# Patient Record
Sex: Male | Born: 1960 | Race: White | Hispanic: No | Smoking: Never smoker
Health system: Southern US, Community
[De-identification: ages and names within clinical notes are randomized; demographics above are authoritative.]

## PROBLEM LIST (undated history)

## (undated) DIAGNOSIS — E039 Hypothyroidism, unspecified: Secondary | ICD-10-CM

## (undated) DIAGNOSIS — D649 Anemia, unspecified: Secondary | ICD-10-CM

## (undated) DIAGNOSIS — E785 Hyperlipidemia, unspecified: Secondary | ICD-10-CM

## (undated) DIAGNOSIS — Z87442 Personal history of urinary calculi: Secondary | ICD-10-CM

## (undated) HISTORY — DX: Personal history of urinary calculi: Z87.442

## (undated) HISTORY — DX: Anemia, unspecified: D64.9

## (undated) HISTORY — DX: Hyperlipidemia, unspecified: E78.5

## (undated) HISTORY — DX: Hypothyroidism, unspecified: E03.9

## (undated) HISTORY — PX: TONSILLECTOMY: SUR1361

---

## 1998-09-17 ENCOUNTER — Encounter: Payer: Self-pay | Admitting: Emergency Medicine

## 1998-09-17 ENCOUNTER — Emergency Department (HOSPITAL_COMMUNITY): Admission: EM | Admit: 1998-09-17 | Discharge: 1998-09-17 | Payer: Self-pay | Admitting: Emergency Medicine

## 2004-07-29 ENCOUNTER — Ambulatory Visit: Payer: Self-pay | Admitting: Internal Medicine

## 2004-08-05 ENCOUNTER — Ambulatory Visit: Payer: Self-pay | Admitting: Internal Medicine

## 2005-08-10 ENCOUNTER — Ambulatory Visit: Payer: Self-pay | Admitting: Internal Medicine

## 2005-08-16 ENCOUNTER — Ambulatory Visit: Payer: Self-pay | Admitting: Internal Medicine

## 2005-10-28 ENCOUNTER — Ambulatory Visit: Payer: Self-pay | Admitting: Internal Medicine

## 2006-01-17 ENCOUNTER — Emergency Department (HOSPITAL_COMMUNITY): Admission: EM | Admit: 2006-01-17 | Discharge: 2006-01-17 | Payer: Self-pay | Admitting: Emergency Medicine

## 2006-08-12 ENCOUNTER — Ambulatory Visit: Payer: Self-pay | Admitting: Internal Medicine

## 2006-08-12 LAB — CONVERTED CEMR LAB
ALT: 17 units/L (ref 0–40)
AST: 19 units/L (ref 0–37)
Albumin: 4.1 g/dL (ref 3.5–5.2)
Alkaline Phosphatase: 77 units/L (ref 39–117)
BUN: 12 mg/dL (ref 6–23)
Basophils Absolute: 0.1 10*3/uL (ref 0.0–0.1)
Basophils Relative: 1.2 % — ABNORMAL HIGH (ref 0.0–1.0)
Bilirubin Urine: NEGATIVE
Bilirubin, Direct: 0.1 mg/dL (ref 0.0–0.3)
CO2: 32 meq/L (ref 19–32)
Calcium: 9.6 mg/dL (ref 8.4–10.5)
Chloride: 106 meq/L (ref 96–112)
Cholesterol: 174 mg/dL (ref 0–200)
Creatinine, Ser: 1 mg/dL (ref 0.4–1.5)
Eosinophils Absolute: 0.1 10*3/uL (ref 0.0–0.6)
Eosinophils Relative: 2.4 % (ref 0.0–5.0)
GFR calc Af Amer: 103 mL/min
GFR calc non Af Amer: 85 mL/min
Glucose, Bld: 96 mg/dL (ref 70–99)
HCT: 41.1 % (ref 39.0–52.0)
HDL: 28.2 mg/dL — ABNORMAL LOW (ref >39.0)
Hemoglobin, Urine: NEGATIVE
Hemoglobin: 14.1 g/dL (ref 13.0–17.0)
Ketones, ur: NEGATIVE mg/dL
LDL Cholesterol: 128 mg/dL — ABNORMAL HIGH (ref 0–99)
Leukocytes, UA: NEGATIVE
Lymphocytes Relative: 23 % (ref 12.0–46.0)
MCHC: 34.3 g/dL (ref 30.0–36.0)
MCV: 91.4 fL (ref 78.0–100.0)
Monocytes Absolute: 0.3 10*3/uL (ref 0.2–0.7)
Monocytes Relative: 5.5 % (ref 3.0–11.0)
Neutro Abs: 3.3 10*3/uL (ref 1.4–7.7)
Neutrophils Relative %: 67.9 % (ref 43.0–77.0)
Nitrite: NEGATIVE
PSA: 1.81 ng/mL (ref 0.10–4.00)
Platelets: 258 10*3/uL (ref 150–400)
Potassium: 4.3 meq/L (ref 3.5–5.1)
RBC: 4.5 M/uL (ref 4.22–5.81)
RDW: 11.7 % (ref 11.5–14.6)
Sodium: 141 meq/L (ref 135–145)
Specific Gravity, Urine: 1.01 (ref 1.000–1.03)
TSH: 0.25 microintl units/mL — ABNORMAL LOW (ref 0.35–5.50)
Total Bilirubin: 0.8 mg/dL (ref 0.3–1.2)
Total CHOL/HDL Ratio: 6.2
Total Protein, Urine: NEGATIVE mg/dL
Total Protein: 7.1 g/dL (ref 6.0–8.3)
Triglycerides: 87 mg/dL (ref 0–149)
Urine Glucose: NEGATIVE mg/dL
Urobilinogen, UA: 0.2 (ref 0.0–1.0)
VLDL: 17 mg/dL (ref 0–40)
WBC: 4.9 10*3/uL (ref 4.5–10.5)
pH: 6 (ref 5.0–8.0)

## 2006-08-19 ENCOUNTER — Ambulatory Visit: Payer: Self-pay | Admitting: Internal Medicine

## 2006-11-07 ENCOUNTER — Ambulatory Visit: Payer: Self-pay | Admitting: Internal Medicine

## 2006-11-07 LAB — CONVERTED CEMR LAB: TSH: 0.46 microintl units/mL (ref 0.35–5.50)

## 2007-02-01 ENCOUNTER — Telehealth: Payer: Self-pay | Admitting: Internal Medicine

## 2007-08-23 ENCOUNTER — Ambulatory Visit: Payer: Self-pay | Admitting: Internal Medicine

## 2007-08-23 LAB — CONVERTED CEMR LAB
ALT: 15 units/L (ref 0–53)
AST: 16 units/L (ref 0–37)
Albumin: 4 g/dL (ref 3.5–5.2)
Alkaline Phosphatase: 54 units/L (ref 39–117)
BUN: 16 mg/dL (ref 6–23)
Basophils Absolute: 0 10*3/uL (ref 0.0–0.1)
Basophils Relative: 0.8 % (ref 0.0–1.0)
Bilirubin Urine: NEGATIVE
Bilirubin, Direct: 0.1 mg/dL (ref 0.0–0.3)
CO2: 30 meq/L (ref 19–32)
Calcium: 9.2 mg/dL (ref 8.4–10.5)
Chloride: 103 meq/L (ref 96–112)
Cholesterol: 172 mg/dL (ref 0–200)
Creatinine, Ser: 1 mg/dL (ref 0.4–1.5)
Eosinophils Absolute: 0.2 10*3/uL (ref 0.0–0.7)
Eosinophils Relative: 4.3 % (ref 0.0–5.0)
GFR calc Af Amer: 103 mL/min
GFR calc non Af Amer: 85 mL/min
Glucose, Bld: 104 mg/dL — ABNORMAL HIGH (ref 70–99)
HCT: 37.8 % — ABNORMAL LOW (ref 39.0–52.0)
HDL: 27.4 mg/dL — ABNORMAL LOW (ref >39.0)
Hemoglobin, Urine: NEGATIVE
Hemoglobin: 13.2 g/dL (ref 13.0–17.0)
Ketones, ur: NEGATIVE mg/dL
LDL Cholesterol: 132 mg/dL — ABNORMAL HIGH (ref 0–99)
Leukocytes, UA: NEGATIVE
Lymphocytes Relative: 27.6 % (ref 12.0–46.0)
MCHC: 35 g/dL (ref 30.0–36.0)
MCV: 92.4 fL (ref 78.0–100.0)
Monocytes Absolute: 0.3 10*3/uL (ref 0.1–1.0)
Monocytes Relative: 5.9 % (ref 3.0–12.0)
Neutro Abs: 2.8 10*3/uL (ref 1.4–7.7)
Neutrophils Relative %: 61.4 % (ref 43.0–77.0)
Nitrite: NEGATIVE
PSA: 1.84 ng/mL (ref 0.10–4.00)
Platelets: 252 10*3/uL (ref 150–400)
Potassium: 4.3 meq/L (ref 3.5–5.1)
RBC: 4.09 M/uL — ABNORMAL LOW (ref 4.22–5.81)
RDW: 11.8 % (ref 11.5–14.6)
Sodium: 139 meq/L (ref 135–145)
Specific Gravity, Urine: 1.025 (ref 1.000–1.03)
TSH: 4.1 microintl units/mL (ref 0.35–5.50)
Total Bilirubin: 0.8 mg/dL (ref 0.3–1.2)
Total CHOL/HDL Ratio: 6.3
Total Protein, Urine: NEGATIVE mg/dL
Total Protein: 7.2 g/dL (ref 6.0–8.3)
Triglycerides: 65 mg/dL (ref 0–149)
Urine Glucose: NEGATIVE mg/dL
Urobilinogen, UA: 0.2 (ref 0.0–1.0)
VLDL: 13 mg/dL (ref 0–40)
WBC: 4.6 10*3/uL (ref 4.5–10.5)
pH: 6 (ref 5.0–8.0)

## 2007-08-29 ENCOUNTER — Ambulatory Visit: Payer: Self-pay | Admitting: Internal Medicine

## 2007-08-29 DIAGNOSIS — Z87442 Personal history of urinary calculi: Secondary | ICD-10-CM

## 2007-08-29 DIAGNOSIS — E039 Hypothyroidism, unspecified: Secondary | ICD-10-CM | POA: Insufficient documentation

## 2008-07-31 ENCOUNTER — Telehealth (INDEPENDENT_AMBULATORY_CARE_PROVIDER_SITE_OTHER): Payer: Self-pay | Admitting: *Deleted

## 2008-08-30 ENCOUNTER — Ambulatory Visit: Payer: Self-pay | Admitting: Internal Medicine

## 2008-08-30 LAB — CONVERTED CEMR LAB
ALT: 19 units/L (ref 0–53)
AST: 23 units/L (ref 0–37)
Albumin: 4 g/dL (ref 3.5–5.2)
Alkaline Phosphatase: 60 units/L (ref 39–117)
BUN: 17 mg/dL (ref 6–23)
Basophils Absolute: 0 10*3/uL (ref 0.0–0.1)
Basophils Relative: 0.9 % (ref 0.0–3.0)
Bilirubin Urine: NEGATIVE
Bilirubin, Direct: 0.1 mg/dL (ref 0.0–0.3)
CO2: 31 meq/L (ref 19–32)
Calcium: 9.3 mg/dL (ref 8.4–10.5)
Chloride: 106 meq/L (ref 96–112)
Cholesterol: 166 mg/dL (ref 0–200)
Creatinine, Ser: 1 mg/dL (ref 0.4–1.5)
Eosinophils Absolute: 0.1 10*3/uL (ref 0.0–0.7)
Eosinophils Relative: 2.9 % (ref 0.0–5.0)
GFR calc non Af Amer: 84.17 mL/min (ref >60)
Glucose, Bld: 98 mg/dL (ref 70–99)
HCT: 36.9 % — ABNORMAL LOW (ref 39.0–52.0)
HDL: 26.1 mg/dL — ABNORMAL LOW (ref >39.00)
Hemoglobin, Urine: NEGATIVE
Hemoglobin: 12.7 g/dL — ABNORMAL LOW (ref 13.0–17.0)
Ketones, ur: NEGATIVE mg/dL
LDL Cholesterol: 121 mg/dL — ABNORMAL HIGH (ref 0–99)
Leukocytes, UA: NEGATIVE
Lymphocytes Relative: 27.9 % (ref 12.0–46.0)
Lymphs Abs: 0.8 10*3/uL (ref 0.7–4.0)
MCHC: 34.4 g/dL (ref 30.0–36.0)
MCV: 87.6 fL (ref 78.0–100.0)
Monocytes Absolute: 0.4 10*3/uL (ref 0.1–1.0)
Monocytes Relative: 12.8 % — ABNORMAL HIGH (ref 3.0–12.0)
Neutro Abs: 1.7 10*3/uL (ref 1.4–7.7)
Neutrophils Relative %: 55.5 % (ref 43.0–77.0)
Nitrite: NEGATIVE
PSA: 1.57 ng/mL (ref 0.10–4.00)
Platelets: 234 10*3/uL (ref 150.0–400.0)
Potassium: 4.5 meq/L (ref 3.5–5.1)
RBC: 4.21 M/uL — ABNORMAL LOW (ref 4.22–5.81)
RDW: 12.5 % (ref 11.5–14.6)
Sodium: 142 meq/L (ref 135–145)
Specific Gravity, Urine: 1.025 (ref 1.000–1.030)
TSH: 0.76 microintl units/mL (ref 0.35–5.50)
Total Bilirubin: 0.8 mg/dL (ref 0.3–1.2)
Total CHOL/HDL Ratio: 6
Total Protein, Urine: NEGATIVE mg/dL
Total Protein: 7.3 g/dL (ref 6.0–8.3)
Triglycerides: 97 mg/dL (ref 0.0–149.0)
Urine Glucose: NEGATIVE mg/dL
Urobilinogen, UA: 0.2 (ref 0.0–1.0)
VLDL: 19.4 mg/dL (ref 0.0–40.0)
WBC: 3 10*3/uL — ABNORMAL LOW (ref 4.5–10.5)
pH: 5.5 (ref 5.0–8.0)

## 2008-09-18 ENCOUNTER — Ambulatory Visit: Payer: Self-pay | Admitting: Internal Medicine

## 2009-09-12 ENCOUNTER — Ambulatory Visit: Payer: Self-pay | Admitting: Internal Medicine

## 2009-09-12 LAB — CONVERTED CEMR LAB
ALT: 15 units/L (ref 0–53)
AST: 16 units/L (ref 0–37)
Albumin: 4.5 g/dL (ref 3.5–5.2)
Alkaline Phosphatase: 64 units/L (ref 39–117)
BUN: 21 mg/dL (ref 6–23)
Basophils Absolute: 0 10*3/uL (ref 0.0–0.1)
Basophils Relative: 0.7 % (ref 0.0–3.0)
Bilirubin Urine: NEGATIVE
Bilirubin, Direct: 0.2 mg/dL (ref 0.0–0.3)
CO2: 32 meq/L (ref 19–32)
Calcium: 9.7 mg/dL (ref 8.4–10.5)
Chloride: 107 meq/L (ref 96–112)
Cholesterol: 184 mg/dL (ref 0–200)
Creatinine, Ser: 1.1 mg/dL (ref 0.4–1.5)
Eosinophils Absolute: 0.2 10*3/uL (ref 0.0–0.7)
Eosinophils Relative: 3.8 % (ref 0.0–5.0)
GFR calc non Af Amer: 75.89 mL/min (ref >60)
Glucose, Bld: 89 mg/dL (ref 70–99)
HCT: 41.5 % (ref 39.0–52.0)
HDL: 34 mg/dL — ABNORMAL LOW (ref >39.00)
Hemoglobin, Urine: NEGATIVE
Hemoglobin: 14.6 g/dL (ref 13.0–17.0)
Ketones, ur: NEGATIVE mg/dL
LDL Cholesterol: 136 mg/dL — ABNORMAL HIGH (ref 0–99)
Leukocytes, UA: NEGATIVE
Lymphocytes Relative: 26.3 % (ref 12.0–46.0)
Lymphs Abs: 1.3 10*3/uL (ref 0.7–4.0)
MCHC: 35.2 g/dL (ref 30.0–36.0)
MCV: 93.3 fL (ref 78.0–100.0)
Monocytes Absolute: 0.3 10*3/uL (ref 0.1–1.0)
Monocytes Relative: 5.7 % (ref 3.0–12.0)
Neutro Abs: 3.2 10*3/uL (ref 1.4–7.7)
Neutrophils Relative %: 63.5 % (ref 43.0–77.0)
Nitrite: NEGATIVE
PSA: 1.64 ng/mL (ref 0.10–4.00)
Platelets: 229 10*3/uL (ref 150.0–400.0)
Potassium: 4.9 meq/L (ref 3.5–5.1)
RBC: 4.45 M/uL (ref 4.22–5.81)
RDW: 12.7 % (ref 11.5–14.6)
Sodium: 140 meq/L (ref 135–145)
Specific Gravity, Urine: 1.025 (ref 1.000–1.030)
TSH: 0.68 microintl units/mL (ref 0.35–5.50)
Total Bilirubin: 1 mg/dL (ref 0.3–1.2)
Total CHOL/HDL Ratio: 5
Total Protein, Urine: NEGATIVE mg/dL
Total Protein: 7.4 g/dL (ref 6.0–8.3)
Triglycerides: 71 mg/dL (ref 0.0–149.0)
Urine Glucose: NEGATIVE mg/dL
Urobilinogen, UA: 0.2 (ref 0.0–1.0)
VLDL: 14.2 mg/dL (ref 0.0–40.0)
WBC: 5 10*3/uL (ref 4.5–10.5)
pH: 6 (ref 5.0–8.0)

## 2009-09-19 ENCOUNTER — Ambulatory Visit: Payer: Self-pay | Admitting: Internal Medicine

## 2009-10-21 ENCOUNTER — Encounter (INDEPENDENT_AMBULATORY_CARE_PROVIDER_SITE_OTHER): Payer: Self-pay | Admitting: *Deleted

## 2009-11-28 ENCOUNTER — Encounter (INDEPENDENT_AMBULATORY_CARE_PROVIDER_SITE_OTHER): Payer: Self-pay | Admitting: *Deleted

## 2009-12-01 ENCOUNTER — Ambulatory Visit: Payer: Self-pay | Admitting: Internal Medicine

## 2009-12-09 ENCOUNTER — Ambulatory Visit: Payer: Self-pay | Admitting: Internal Medicine

## 2009-12-22 ENCOUNTER — Ambulatory Visit: Payer: Self-pay | Admitting: Internal Medicine

## 2009-12-24 LAB — CONVERTED CEMR LAB
BUN: 24 mg/dL — ABNORMAL HIGH (ref 6–23)
Chloride: 101 meq/L (ref 96–112)
Cholesterol: 156 mg/dL (ref 0–200)
Creatinine, Ser: 1.2 mg/dL (ref 0.4–1.5)
LDL Cholesterol: 108 mg/dL — ABNORMAL HIGH (ref 0–99)
Total CHOL/HDL Ratio: 5

## 2010-03-31 NOTE — Assessment & Plan Note (Signed)
Summary: CPX /NWS   Vital Signs:  Patient profile:   50 year old male Height:      72 inches Weight:      197 pounds BMI:     26.81 O2 Sat:      98 % on Room air Temp:     97.7 degrees F oral Pulse rate:   60 / minute Pulse rhythm:   regular Resp:     16 per minute BP sitting:   130 / 88  (left arm) Cuff size:   regular  Vitals Entered By: Lanier Prude, CMA(AAMA) (September 19, 2009 3:20 PM)  O2 Flow:  Room air CC: CPX Is Patient Diabetic? No   CC:  CPX.  History of Present Illness: The patient presents for a wellness examination   Current Medications (verified): 1)  Levothyroxine Sodium 150 Mcg  Tabs (Levothyroxine Sodium) .... Once Daily 2)  Vitamin D3 1000 Unit  Tabs (Cholecalciferol) .Marland Kitchen.. 1 By Mouth Daily 3)  Acyclovir 200 Mg Caps (Acyclovir) .... Once Daily  Allergies (verified): No Known Drug Allergies  Past History:  Past Surgical History: Last updated: 08/29/2007 Denies surgical history  Family History: Last updated: 08/29/2007 F was depressed and comm. suicide at 69  Social History: Last updated: 09/18/2008 Married no children Never Smoked Alcohol use-no Regular exercise-yes tennis, running  Past Medical History: Hypothyroidism Nephrolithiasis, hx of Blood donor q 2-3 mo Hyperlipidemia  Review of Systems  The patient denies anorexia, fever, weight loss, weight gain, vision loss, decreased hearing, hoarseness, chest pain, syncope, dyspnea on exertion, peripheral edema, prolonged cough, headaches, hemoptysis, abdominal pain, melena, hematochezia, severe indigestion/heartburn, hematuria, incontinence, genital sores, muscle weakness, suspicious skin lesions, transient blindness, difficulty walking, depression, unusual weight change, abnormal bleeding, enlarged lymph nodes, angioedema, and testicular masses.    Physical Exam  General:  Well-developed,well-nourished,in no acute distress; alert,appropriate and cooperative throughout examination Head:   Normocephalic and atraumatic without obvious abnormalities. No apparent alopecia or balding. Eyes:  No corneal or conjunctival inflammation noted. EOMI. Perrla. Funduscopic exam benign, without hemorrhages, exudates or papilledema. Vision grossly normal. Ears:  External ear exam shows no significant lesions or deformities.  Otoscopic examination reveals clear canals, tympanic membranes are intact bilaterally without bulging, retraction, inflammation or discharge. Hearing is grossly normal bilaterally. Nose:  External nasal examination shows no deformity or inflammation. Nasal mucosa are pink and moist without lesions or exudates. Mouth:  Oral mucosa and oropharynx without lesions or exudates.  Teeth in good repair. Neck:  No deformities, masses, or tenderness noted. Chest Wall:  No deformities, masses, tenderness or gynecomastia noted. Lungs:  Normal respiratory effort, chest expands symmetrically. Lungs are clear to auscultation, no crackles or wheezes. Heart:  Normal rate and regular rhythm. S1 and S2 normal without gallop, murmur, click, rub or other extra sounds. Abdomen:  Bowel sounds positive,abdomen soft and non-tender without masses, organomegaly or hernias noted. Rectal:  def to GI Prostate:  def to GI Msk:  No deformity or scoliosis noted of thoracic or lumbar spine.   Pulses:  R and L carotid,radial,femoral,dorsalis pedis and posterior tibial pulses are full and equal bilaterally Extremities:  No clubbing, cyanosis, edema, or deformity noted with normal full range of motion of all joints.   Neurologic:  No cranial nerve deficits noted. Station and gait are normal. Plantar reflexes are down-going bilaterally. DTRs are symmetrical throughout. Sensory, motor and coordinative functions appear intact. Skin:  Intact without suspicious lesions or rashes Cervical Nodes:  No lymphadenopathy noted Inguinal Nodes:  No  significant adenopathy Psych:  Cognition and judgment appear intact. Alert and  cooperative with normal attention span and concentration. No apparent delusions, illusions, hallucinations   Impression & Recommendations:  Problem # 1:  WELL ADULT EXAM (ICD-V70.0) Assessment New Health and age related issues were discussed. Available screening tests and vaccinations were discussed as well. Healthy life style including good diet and execise was discussed.  Orders: EKG w/ Interpretation (93000) Gastroenterology Referral (GI)  Problem # 2:  HYPOTHYROIDISM (ICD-244.9) Assessment: Unchanged  His updated medication list for this problem includes:    Levothyroxine Sodium 150 Mcg Tabs (Levothyroxine sodium) ..... Once daily  Problem # 3:  HYPERLIPIDEMIA (ICD-272.4) Assessment: Deteriorated  His updated medication list for this problem includes:    Pravastatin Sodium 20 Mg Tabs (Pravastatin sodium) .Marland Kitchen... 1 by mouth qd  Labs Reviewed: SGOT: 16 (09/12/2009)   SGPT: 15 (09/12/2009)   HDL:34.00 (09/12/2009), 26.10 (08/30/2008)  LDL:136 (09/12/2009), 121 (08/30/2008)  Chol:184 (09/12/2009), 166 (08/30/2008)  Trig:71.0 (09/12/2009), 97.0 (08/30/2008)  Complete Medication List: 1)  Levothyroxine Sodium 150 Mcg Tabs (Levothyroxine sodium) .... Once daily 2)  Vitamin D3 1000 Unit Tabs (Cholecalciferol) .Marland Kitchen.. 1 by mouth daily 3)  Acyclovir 200 Mg Caps (Acyclovir) .... Once daily 4)  Aspirin 81 Mg Tbec (Aspirin) .Marland Kitchen.. 1 by mouth qd 5)  Pravastatin Sodium 20 Mg Tabs (Pravastatin sodium) .Marland Kitchen.. 1 by mouth qd  Patient Instructions: 1)  Please schedule a follow-up appointment in 1 year. 2)  Please schedule a follow-up appointment in 3 months. 3)  BMP prior to visit, ICD-9: 4)  Lipid Panel prior to visit, ICD-9:272.0 995.20 Prescriptions: PRAVASTATIN SODIUM 20 MG TABS (PRAVASTATIN SODIUM) 1 by mouth qd  #30 x 12   Entered and Authorized by:   Tresa Garter MD   Signed by:   Tresa Garter MD on 09/19/2009   Method used:   [facility] to        [facility]   [phone]* (retail)       [address]       [place], [place]  [address]       Ph: [phone] or [phone]       Fax: [phone]   RxID:   [id number] LEVOTHYROXINE SODIUM 150 MCG  TABS (LEVOTHYROXINE SODIUM) once daily  #30 x 12   Entered and Authorized by:   Tresa Garter MD   Signed by:   Tresa Garter MD on 09/19/2009   Method used:   [facility]ronically to        [facility]  [phone]* (retail)       [address] [place], [place]  [address]       Ph: [phone] or [phone]       Fax: [phone]   RxID:   [id number] LEVOTHYROXINE SODIUM 150 MCG  TABS (LEVOTHYROXINE SODIUM) once daily  #30 x 12   Entered and Authorized by:   Tresa Garter MD   Signed by:   Tresa Garter MD on 09/19/2009   Method used:   Print then Give to Patient   RxID:   [id number] PRAVASTATIN SODIUM 20 MG TABS (PRAVASTATIN SODIUM) 1 by mouth qd  #30 x 12   Entered and Authorized by:   Tresa Garter MD   Signed by:   Tresa Garter MD on 09/19/2009   Method used:   Print then Give to Patient   RxID:  [id number]  

## 2010-03-31 NOTE — Letter (Signed)
Summary: [facility] Instructions  [facility]  [address] [place], [place] [address]   Phone: [phone]  Fax: [phone]       Derrick Walters    52/13/1962    MRN: [id number]        Procedure Day Dorna Bloom:  Jake Shark  12/09/09     Arrival Time:  10:00AM     Procedure Time:  11:00AM     Location of Procedure:                    _X _  [facility] (4th Floor)                       PREPARATION FOR COLONOSCOPY WITH MOVIPREP   Starting 5 days prior to your procedure 12/04/09 do not eat nuts, seeds, popcorn, corn, beans, peas,  salads, or any raw vegetables.  Do not take any fiber supplements (e.g. Metamucil, Citrucel, and Benefiber).  THE DAY BEFORE YOUR PROCEDURE         DATE: 12/08/09  DAY: MONDAY  1.  Drink clear liquids the entire day-NO SOLID FOOD  2.  Do not drink anything colored red or purple.  Avoid juices with pulp.  No orange juice.  3.  Drink at least 64 oz. (8 glasses) of fluid/clear liquids during the day to prevent dehydration and help the prep work efficiently.  CLEAR LIQUIDS INCLUDE: Water Jello Ice Popsicles Tea (sugar ok, no milk/cream) Powdered fruit flavored drinks Coffee (sugar ok, no milk/cream) Gatorade Juice: apple, white grape, white cranberry  Lemonade Clear bullion, consomm, broth Carbonated beverages (any kind) Strained chicken noodle soup Hard Candy                             4.  In the morning, mix first dose of MoviPrep solution:    Empty 1 Pouch A and 1 Pouch B into the disposable container    Add lukewarm drinking water to the top line of the container. Mix to dissolve    Refrigerate (mixed solution should be used within 24 hrs)  5.  Begin drinking the prep at 5:00 p.m. The MoviPrep container is divided by 4 marks.   Every 15 minutes drink the solution down to the next mark (approximately 8 oz) until the full liter is complete.   6.  Follow completed prep with 16 oz of clear liquid of your choice (Nothing  red or purple).  Continue to drink clear liquids until bedtime.  7.  Before going to bed, mix second dose of MoviPrep solution:    Empty 1 Pouch A and 1 Pouch B into the disposable container    Add lukewarm drinking water to the top line of the container. Mix to dissolve    Refrigerate  THE DAY OF YOUR PROCEDURE      DATE: 12/09/09  DAY: TUESDAY  Beginning at 6:00AM (5 hours before procedure):         1. Every 15 minutes, drink the solution down to the next mark (approx 8 oz) until the full liter is complete.  2. Follow completed prep with 16 oz. of clear liquid of your choice.    3. You may drink clear liquids until 9:00AM (2 HOURS BEFORE PROCEDURE).   MEDICATION INSTRUCTIONS  Unless otherwise instructed, you should take regular prescription medications with a small sip of water   as early as possible the morning of  your procedure.    Additional medication instructions: n/a         OTHER INSTRUCTIONS  You will need a responsible adult at least 52 years of age to accompany you and drive you home.   This person must remain in the waiting room during your procedure.  Wear loose fitting clothing that is easily removed.  Leave jewelry and other valuables at home.  However, you may wish to bring a book to read or  an iPod/MP3 player to listen to music as you wait for your procedure to start.  Remove all body piercing jewelry and leave at home.  Total time from sign-in until discharge is approximately 2-3 hours.  You should go home directly after your procedure and rest.  You can resume normal activities the  day after your procedure.  The day of your procedure you should not:   Drive   Make legal decisions   Operate machinery   Drink alcohol   Return to work  You will receive specific instructions about eating, activities and medications before you leave.    The above instructions have been reviewed and explained to me by   Sherren Kerns RN  December 01, 2009 8:04 AM   I fully understand and can verbalize these instructions _____________________________ Date _________

## 2010-03-31 NOTE — Miscellaneous (Signed)
Summary: previsit prep/rm  Clinical Lists Changes  Medications: Added new medication of MOVIPREP 100 GM  SOLR (PEG-KCL-NACL-NASULF-NA ASC-C) As per prep instructions. - Signed Rx of MOVIPREP 100 GM  SOLR (PEG-KCL-NACL-NASULF-NA ASC-C) As per prep instructions.;  #1 x 0;  Signed;  Entered by: Sherren Kerns RN;  Authorized by: Iva Boop MD, [facility];  Method used: [facility]  [phone]*, [address], [place], [place]  [address], Ph: [phone] or [phone], Fax: [phone] Observations: Added new observation of ALLERGY REV: Done (12/01/2009 7:50)    Prescriptions: MOVIPREP 100 GM  SOLR (PEG-KCL-NACL-NASULF-NA ASC-C) As per prep instructions.  #1 x 0   Entered by:   Sherren Kerns RN   Authorized by:   Iva Boop MD, [facility]   Signed by:   Sherren Kerns RN on 12/01/2009   Method used:   Electronically to        [facility]  [phone]* (retail)       [address]       [place], [place]  [address]       Ph: [phone] or [phone]       Fax: [phone]   RxID:   [id number]

## 2010-03-31 NOTE — Procedures (Signed)
Summary: Colonoscopy  Patient: Derrick Walters Note: All result statuses are Final unless otherwise noted.  Tests: (1) Colonoscopy (COL)   COL Colonoscopy           DONE     [facility]     [address]     [place], [place]  [address]           COLONOSCOPY PROCEDURE REPORT           PATIENT:  Derrick, Walters  MR#:  [id number]     BIRTHDATE:  11-Dec-1960, 48 yrs. old  GENDER:  male     ENDOSCOPIST:  Iva Boop, MD, [facility]     REF. BY:  Linda Hedges. Plotnikov, M.D.     PROCEDURE DATE:  12/09/2009     PROCEDURE:  Colonoscopy 19[address]     ASA CLASS:  Class II     INDICATIONS:  Routine Risk Screening     MEDICATIONS:   Fentanyl 75 mcg IV, Versed 7 mg IV           DESCRIPTION OF PROCEDURE:   After the risks benefits and     alternatives of the procedure were thoroughly explained, informed     consent was obtained.  Digital rectal exam was performed and     revealed no abnormalities and normal prostate.   The LB CF-H180AL     [id number] endoscope was introduced through the anus and advanced to     the cecum, which was identified by both the appendix and ileocecal     valve, without limitations.  The quality of the prep was     excellent, using MoviPrep.  The instrument was then slowly     withdrawn as the colon was fully examined. Insertion: 5:05 minutes     Withdrawal: 8:14 minutes     <<PROCEDUREIMAGES>>           FINDINGS:  Mild diverticulosis was found in the sigmoid colon.     This was otherwise a normal examination of the colon.   Retroflexed     views in the rectum and right colon revealed no abnormalities.     The scope was then withdrawn from the patient and the procedure     completed.           COMPLICATIONS:  None           ENDOSCOPIC IMPRESSION:     1) Mild diverticulosis in the sigmoid colon     2) Otherwise normal, excellent prep           REPEAT EXAM:  In 10 year(s) for routine screening colonoscopy.           Iva Boop, MD, [facility]           CC:  Linda Hedges.  Plotnikov, MD     The Patient           n.     eSIGNED:   Iva Boop at 12/09/2009 12:07 PM           Lenor Derrick, [phone]  Note: An exclamation mark (!) indicates a result that was not dispersed into the flowsheet. Document Creation Date: 12/09/2009 12:09 PM _______________________________________________________________________  (1) Order result status: Final Collection or observation date-time: 12/09/2009 11:59 Requested date-time:  Receipt date-time:  Reported date-time:  Referring Physician:   Ordering Physician: Stan Head [phone]) Specimen Source:  Source: Launa Grill Order Number: [id number] Lab site:   Appended Document: Colonoscopy  Clinical Lists Changes  Observations: Added new observation of COLONNXTDUE: 11/2019 (12/09/2009 15:12)

## 2010-03-31 NOTE — Letter (Signed)
Summary: Previsit letter  [facility]  [address] [place], [place] [address]   Phone: [phone]  Fax: [phone]       10/21/2009 MRN: [id number]  [facility] [address] [place], [place]  [address]  Dear Derrick Walters,  Welcome to the Gastroenterology Division at [facility].    You are scheduled to see a nurse for your pre-procedure visit on 12/01/2009 at 8:00AM on the 3rd floor at [facility], [address]  We ask that you try to arrive at our office 15 minutes prior to your appointment time to allow for check-in.  Your nurse visit will consist of discussing your medical and surgical history, your immediate family medical history, and your medications.    Please bring a complete list of all your medications or, if you prefer, bring the medication bottles and we will list them.  We will need to be aware of both prescribed and over the counter drugs.  We will need to know exact dosage information as well.  If you are on blood thinners (Coumadin, Plavix, Aggrenox, Ticlid, etc.) please call our office today/prior to your appointment, as we need to consult with your physician about holding your medication.   Please be prepared to read and sign documents such as consent forms, a financial agreement, and acknowledgement forms.  If necessary, and with your consent, a friend or relative is welcome to sit-in on the nurse visit with you.  Please bring your insurance card so that we may make a copy of it.  If your insurance requires a referral to see a specialist, please bring your referral form from your primary care physician.  No co-pay is required for this nurse visit.     If you cannot keep your appointment, please call [phone] to cancel or reschedule prior to your appointment date.  This allows [place] the opportunity to schedule an appointment for another patient in need of care.    Thank you for choosing [facility] for your medical needs.  We  appreciate the opportunity to care for you.  Please visit [place] at our website  to learn more about our practice.                     Sincerely.                                                                                                                   The Gastroenterology Division

## 2010-07-14 NOTE — Assessment & Plan Note (Signed)
[facility]                           PRIMARY CARE OFFICE NOTE   NAME:Derrick Walters, Derrick Walters                        MRN:          [id number]  DATE:08/19/2006                            DOB:          [birth year]    The patient is a 50 year old male who presents for a wellness  examination.   ALLERGIES:  None.   PAST MEDICAL HISTORY/FAMILY HISTORY/SOCIAL HISTORY:  As per August 16, 2005 note.  He is being interviewed for another job with [employer].   CURRENT MEDICATIONS:  1. Acyclovir 400 mg daily for ophthalmic zoster.  2. Levothroid 175 mcg a day.  3. Multivitamin at times.   REVIEW OF SYSTEMS:  No chest pain or shortness of breath.  No syncope.  No neurological complaints.  No eye complaints.  The rest of the 18-  point review of systems is negative.   PHYSICAL:  Blood pressure 130/86, pulse 71, temperature 97.5, weight 200  pounds.  HEENT:  Moist mucosa.  NECK:  Supple.  No thyromegaly or bruits.  LUNGS:  Clear to auscultation and percussion.  No wheeze or rales.  HEART:  S1, S2.  No murmur.  No gallop.  ABDOMEN:  Soft and nontender.  No organomegaly or mass felt.  LOWER EXTREMITIES:  Without edema.  He is alert and cooperative.  Denies being depressed.  Normal external genitalia.  ABDOMEN:  No organomegaly or mass.  SKIN:  Clear.   LABS:  On August 12, 2006, CBC normal, CMET normal.  TSH 0.25.  Cholesterol 134, HDL 28, LDL 128.  EKG today is normal.  PSA 1.81.  Urinalysis normal.   ASSESSMENT AND PLAN:  1. Normal wellness examination.  Age/health issues discussed.  Healthy      life-style discussed.  Repeat exam in 12 months.  Vaccination      discussed.  He had a tetanus shot in 2006.  2. Hypothyroidism with decreased TSH.  Will reduce Synthroid to 150      mcg a day.  Check TSH in 2 to 3 months.  3. Low HDL.  He will start Lovaza 2 capsules twice daily.  Repeat      lipids in 3 months.    Georgina Quint. Plotnikov, MD  Electronically Signed   AVP/MedQ  DD: 08/20/2006  DT: 08/20/2006  Job #: [id number]

## 2010-07-17 NOTE — Assessment & Plan Note (Signed)
[facility]                             PRIMARY CARE OFFICE NOTE   NAME:Condie, SION REINDERS                        MRN:          [id number]  DATE:10/28/2005                            DOB:          12-13-1960    PROCEDURE:  Skin biopsy.   INDICATIONS:  Mole with irregular color and borders.   The risks, including bleeding, infection, scar formation, incomplete  procedure, were explained to the patient in detail.  He agreed to proceed.   Mole #1 on the left upper back, measuring 3 mm, was prepped with Betadine  and alcohol 0.5 cc and removed with sterile derm blade.  Specimen sent to  the lab.  The wound was treated with Hyfercater.  Tolerated it well.  Complications none.  Wound instructions provided.   Mole #2, below mole #1, measuring 3 mm, was removed in an identical fashion.  The specimen sent to lab.  Tolerated it well.  Complications none.                                   Sonda Primes, MD   AP/MedQ  DD:  10/28/2005  DT:  10/28/2005  Job #:  [id number]

## 2010-09-30 ENCOUNTER — Other Ambulatory Visit: Payer: Self-pay | Admitting: Internal Medicine

## 2010-09-30 ENCOUNTER — Other Ambulatory Visit (INDEPENDENT_AMBULATORY_CARE_PROVIDER_SITE_OTHER): Payer: Self-pay

## 2010-09-30 DIAGNOSIS — Z0000 Encounter for general adult medical examination without abnormal findings: Secondary | ICD-10-CM

## 2010-09-30 DIAGNOSIS — Z0389 Encounter for observation for other suspected diseases and conditions ruled out: Secondary | ICD-10-CM

## 2010-09-30 LAB — BASIC METABOLIC PANEL
BUN: 18 mg/dL (ref 6–23)
CO2: 31 mEq/L (ref 19–32)
Chloride: 103 mEq/L (ref 96–112)
Potassium: 4.5 mEq/L (ref 3.5–5.1)

## 2010-09-30 LAB — LIPID PANEL
Cholesterol: 137 mg/dL (ref 0–200)
LDL Cholesterol: 88 mg/dL (ref 0–99)
Total CHOL/HDL Ratio: 5
VLDL: 19.6 mg/dL (ref 0.0–40.0)

## 2010-09-30 LAB — TSH: TSH: 2.29 u[IU]/mL (ref 0.35–5.50)

## 2010-09-30 LAB — URINALYSIS
Hgb urine dipstick: NEGATIVE
Ketones, ur: NEGATIVE
Leukocytes, UA: NEGATIVE
Urine Glucose: NEGATIVE
Urobilinogen, UA: 0.2 (ref 0.0–1.0)

## 2010-09-30 LAB — CBC WITH DIFFERENTIAL/PLATELET
Eosinophils Relative: 4.7 % (ref 0.0–5.0)
Monocytes Relative: 5.9 % (ref 3.0–12.0)
Neutrophils Relative %: 62.8 % (ref 43.0–77.0)
Platelets: 220 10*3/uL (ref 150.0–400.0)
WBC: 4.8 10*3/uL (ref 4.5–10.5)

## 2010-09-30 LAB — PSA: PSA: 1.69 ng/mL (ref 0.10–4.00)

## 2010-10-01 LAB — HEPATIC FUNCTION PANEL
ALT: 22 U/L (ref 0–53)
Total Bilirubin: 0.6 mg/dL (ref 0.3–1.2)
Total Protein: 7 g/dL (ref 6.0–8.3)

## 2010-10-07 ENCOUNTER — Ambulatory Visit (INDEPENDENT_AMBULATORY_CARE_PROVIDER_SITE_OTHER): Payer: BC Managed Care – PPO | Admitting: Internal Medicine

## 2010-10-07 ENCOUNTER — Encounter: Payer: Self-pay | Admitting: Internal Medicine

## 2010-10-07 VITALS — BP 118/80 | HR 84 | Temp 98.6°F | Resp 16 | Ht 73.0 in | Wt 203.0 lb

## 2010-10-07 DIAGNOSIS — E785 Hyperlipidemia, unspecified: Secondary | ICD-10-CM

## 2010-10-07 DIAGNOSIS — Z0000 Encounter for general adult medical examination without abnormal findings: Secondary | ICD-10-CM

## 2010-10-07 DIAGNOSIS — E039 Hypothyroidism, unspecified: Secondary | ICD-10-CM

## 2010-10-07 DIAGNOSIS — D485 Neoplasm of uncertain behavior of skin: Secondary | ICD-10-CM | POA: Insufficient documentation

## 2010-10-07 MED ORDER — OMEGA-3 FATTY ACIDS 1000 MG PO CAPS: 2.0000 g | ORAL_CAPSULE | Freq: Every day | ORAL | Status: DC

## 2010-10-07 MED ORDER — LEVOTHYROXINE SODIUM 150 MCG PO TABS: 150.0000 ug | ORAL_TABLET | Freq: Every day | ORAL | Status: DC

## 2010-10-07 NOTE — Assessment & Plan Note (Signed)
Issues discussed.

## 2010-10-07 NOTE — Assessment & Plan Note (Signed)
On Rx 

## 2010-10-07 NOTE — Assessment & Plan Note (Signed)
D/c Pravachol - did not help Start fish oil

## 2010-10-07 NOTE — Assessment & Plan Note (Signed)
Will bx 

## 2010-10-07 NOTE — Progress Notes (Signed)
  Subjective:    Patient ID: Derrick Walters, male    DOB: Jul 05, 1960, 50 y.o.   MRN: [id number]  HPI The patient is here for a wellness exam. The patient has been doing well overall without major physical or psychological issues going on lately.   Review of Systems  Constitutional: Negative for appetite change, fatigue and unexpected weight change.  HENT: Negative for nosebleeds, congestion, sore throat, sneezing, trouble swallowing and neck pain.   Eyes: Negative for itching and visual disturbance.  Respiratory: Negative for cough.   Cardiovascular: Negative for chest pain, palpitations and leg swelling.  Gastrointestinal: Negative for nausea, diarrhea, blood in stool and abdominal distention.  Genitourinary: Negative for frequency and hematuria.  Musculoskeletal: Negative for back pain, joint swelling and gait problem.  Skin: Negative for rash.  Neurological: Negative for dizziness, tremors, speech difficulty and weakness.  Psychiatric/Behavioral: Negative for sleep disturbance, dysphoric mood and agitation. The patient is not nervous/anxious.        Objective:   Physical Exam  Constitutional: He is oriented to person, place, and time. He appears well-developed.  HENT:  Mouth/Throat: Oropharynx is clear and moist.  Eyes: Conjunctivae are normal. Pupils are equal, round, and reactive to light.  Neck: Normal range of motion. No JVD present. No thyromegaly present.  Cardiovascular: Normal rate, regular rhythm, normal heart sounds and intact distal pulses.  Exam reveals no gallop and no friction rub.   No murmur heard. Pulmonary/Chest: Effort normal and breath sounds normal. No respiratory distress. He has no wheezes. He has no rales. He exhibits no tenderness.  Abdominal: Soft. Bowel sounds are normal. He exhibits no distension and no mass. There is no tenderness. There is no rebound and no guarding.  Genitourinary: Rectum normal, prostate normal and penis normal.  Musculoskeletal:  Normal range of motion. He exhibits no edema and no tenderness.  Lymphadenopathy:    He has no cervical adenopathy.  Neurological: He is alert and oriented to person, place, and time. He has normal reflexes. No cranial nerve deficit. He exhibits normal muscle tone. Coordination normal.  Skin: Skin is warm and dry. No rash noted.       Mole L chest irreg  Psychiatric: He has a normal mood and affect. His behavior is normal. Judgment and thought content normal.     Lab Results  Component Value Date   WBC 4.8 09/30/2010   HGB 13.8 09/30/2010   HCT 40.6 09/30/2010   PLT 220.0 09/30/2010   CHOL 137 09/30/2010   TRIG 98.0 09/30/2010   HDL 29.80* 09/30/2010   ALT 22 09/30/2010   AST 21 09/30/2010   NA 140 09/30/2010   K 4.5 09/30/2010   CL 103 09/30/2010   CREATININE 1.0 09/30/2010   BUN 18 09/30/2010   CO2 31 09/30/2010   TSH 2.29 09/30/2010   PSA 1.69 09/30/2010        Assessment & Plan:

## 2010-10-20 ENCOUNTER — Other Ambulatory Visit: Payer: Self-pay | Admitting: Internal Medicine

## 2010-11-27 ENCOUNTER — Other Ambulatory Visit: Payer: Self-pay | Admitting: *Deleted

## 2010-11-27 MED ORDER — LEVOTHYROXINE SODIUM 150 MCG PO TABS: 150.0000 ug | ORAL_TABLET | Freq: Every day | ORAL | Status: DC

## 2011-02-17 ENCOUNTER — Encounter: Payer: Self-pay | Admitting: Internal Medicine

## 2011-02-17 ENCOUNTER — Ambulatory Visit (INDEPENDENT_AMBULATORY_CARE_PROVIDER_SITE_OTHER): Payer: BC Managed Care – PPO | Admitting: Internal Medicine

## 2011-02-17 VITALS — BP 120/90 | HR 76 | Temp 98.0°F | Resp 16 | Wt 206.0 lb

## 2011-02-17 DIAGNOSIS — D489 Neoplasm of uncertain behavior, unspecified: Secondary | ICD-10-CM

## 2011-02-17 NOTE — Progress Notes (Signed)
  Subjective:    Patient ID: Derrick Walters, male    DOB: 08/27/1960, 50 y.o.   MRN: [id number]  HPI  Skin bx  Review of Systems     Objective:   Physical Exam  Procedure Note :     Procedure :  Skin biopsy   Indication:  Changing mole (s ),  Suspicious lesion(s)   Risks including unsuccessful procedure , bleeding, infection, bruising, scar, a need for another complete procedure and others were explained to the patient in detail as well as the benefits. Informed consent was obtained and signed.   The patient was placed in a decubitus position.  Lesion #1 on  L clavicular area   measuring  6x5   mm   Skin over lesion #1  was prepped with Betadine and alcohol  and anesthetized with 1 cc of 2% lidocaine and epinephrine, using a 25-gauge 1 inch needle.  Shave biopsy with a sterile Dermablade was carried out in the usual fashion. Hyfrecator was used to destroy the rest of the lesion potentially left behind and for hemostasis. Band-Aid was applied with antibiotic ointment.     Postprocedure instructions :    A Band-Aid should be  changed twice daily. You can take a shower tomorrow.  Keep the wounds clean. You can wash them with liquid soap and water. Pat dry with gauze or a Kleenex tissue  Before applying antibiotic ointment and a Band-Aid.   You need to report immediately  if fever, chills or any signs of infection develop.    The biopsy results should be available in 1 -2 weeks.         Assessment & Plan:

## 2011-02-17 NOTE — Patient Instructions (Signed)
Postprocedure instructions :    A Band-Aid should be  changed twice daily. You can take a shower tomorrow.  Keep the wounds clean. You can wash them with liquid soap and water. Pat dry with gauze or a Kleenex tissue  Before applying antibiotic ointment and a Band-Aid.   You need to report immediately  if fever, chills or any signs of infection develop.    The biopsy results should be available in 1 -2 weeks. 

## 2011-02-21 ENCOUNTER — Encounter: Payer: Self-pay | Admitting: Internal Medicine

## 2011-04-27 ENCOUNTER — Other Ambulatory Visit (INDEPENDENT_AMBULATORY_CARE_PROVIDER_SITE_OTHER): Payer: BC Managed Care – PPO

## 2011-04-27 DIAGNOSIS — E039 Hypothyroidism, unspecified: Secondary | ICD-10-CM

## 2011-04-27 DIAGNOSIS — E785 Hyperlipidemia, unspecified: Secondary | ICD-10-CM

## 2011-04-27 LAB — LIPID PANEL
Cholesterol: 176 mg/dL (ref 0–200)
HDL: 33.4 mg/dL — ABNORMAL LOW (ref >39.00)
LDL Cholesterol: 125 mg/dL — ABNORMAL HIGH (ref 0–99)
VLDL: 17.8 mg/dL (ref 0.0–40.0)

## 2011-04-27 LAB — TSH: TSH: 1.7 u[IU]/mL (ref 0.35–5.50)

## 2011-05-05 ENCOUNTER — Other Ambulatory Visit: Payer: Self-pay | Admitting: Internal Medicine

## 2011-10-19 ENCOUNTER — Other Ambulatory Visit: Payer: Self-pay | Admitting: Internal Medicine

## 2011-11-04 ENCOUNTER — Other Ambulatory Visit (INDEPENDENT_AMBULATORY_CARE_PROVIDER_SITE_OTHER): Payer: BC Managed Care – PPO

## 2011-11-04 ENCOUNTER — Other Ambulatory Visit: Payer: Self-pay | Admitting: *Deleted

## 2011-11-04 DIAGNOSIS — Z125 Encounter for screening for malignant neoplasm of prostate: Secondary | ICD-10-CM

## 2011-11-04 DIAGNOSIS — Z0000 Encounter for general adult medical examination without abnormal findings: Secondary | ICD-10-CM

## 2011-11-04 LAB — LIPID PANEL
HDL: 29.5 mg/dL — ABNORMAL LOW (ref >39.00)
Total CHOL/HDL Ratio: 6
Triglycerides: 82 mg/dL (ref 0.0–149.0)
VLDL: 16.4 mg/dL (ref 0.0–40.0)

## 2011-11-04 LAB — CBC WITH DIFFERENTIAL/PLATELET
Basophils Absolute: 0 10*3/uL (ref 0.0–0.1)
Basophils Relative: 0.9 % (ref 0.0–3.0)
Eosinophils Absolute: 0.2 10*3/uL (ref 0.0–0.7)
Lymphocytes Relative: 25.4 % (ref 12.0–46.0)
MCHC: 32.7 g/dL (ref 30.0–36.0)
Monocytes Relative: 6.3 % (ref 3.0–12.0)
Neutrophils Relative %: 63.5 % (ref 43.0–77.0)
RBC: 4.15 Mil/uL — ABNORMAL LOW (ref 4.22–5.81)
WBC: 4.7 10*3/uL (ref 4.5–10.5)

## 2011-11-04 LAB — HEPATIC FUNCTION PANEL
Albumin: 4 g/dL (ref 3.5–5.2)
Total Protein: 6.9 g/dL (ref 6.0–8.3)

## 2011-11-04 LAB — URINALYSIS, ROUTINE W REFLEX MICROSCOPIC
Hgb urine dipstick: NEGATIVE
Leukocytes, UA: NEGATIVE
Nitrite: NEGATIVE
Urobilinogen, UA: 0.2 (ref 0.0–1.0)

## 2011-11-04 LAB — BASIC METABOLIC PANEL
CO2: 29 mEq/L (ref 19–32)
Calcium: 9.5 mg/dL (ref 8.4–10.5)
Creatinine, Ser: 1.1 mg/dL (ref 0.4–1.5)
Glucose, Bld: 89 mg/dL (ref 70–99)

## 2011-11-04 LAB — TSH: TSH: 5.02 u[IU]/mL (ref 0.35–5.50)

## 2011-11-10 ENCOUNTER — Encounter: Payer: BC Managed Care – PPO | Admitting: Internal Medicine

## 2011-11-16 ENCOUNTER — Ambulatory Visit (INDEPENDENT_AMBULATORY_CARE_PROVIDER_SITE_OTHER): Payer: BC Managed Care – PPO | Admitting: Internal Medicine

## 2011-11-16 ENCOUNTER — Encounter: Payer: Self-pay | Admitting: Internal Medicine

## 2011-11-16 VITALS — BP 130/70 | HR 80 | Temp 97.8°F | Resp 16 | Ht 73.0 in | Wt 207.0 lb

## 2011-11-16 DIAGNOSIS — E785 Hyperlipidemia, unspecified: Secondary | ICD-10-CM

## 2011-11-16 DIAGNOSIS — E039 Hypothyroidism, unspecified: Secondary | ICD-10-CM

## 2011-11-16 DIAGNOSIS — Z0000 Encounter for general adult medical examination without abnormal findings: Secondary | ICD-10-CM

## 2011-11-16 DIAGNOSIS — D485 Neoplasm of uncertain behavior of skin: Secondary | ICD-10-CM

## 2011-11-16 DIAGNOSIS — D649 Anemia, unspecified: Secondary | ICD-10-CM

## 2011-11-16 MED ORDER — LEVOTHYROXINE SODIUM 150 MCG PO TABS: 150.0000 ug | ORAL_TABLET | Freq: Every day | ORAL | Status: DC

## 2011-11-16 NOTE — Assessment & Plan Note (Signed)
Fish oil

## 2011-11-16 NOTE — Assessment & Plan Note (Signed)
TSH in 6 mo Continue with current prescription therapy as reflected on the Med list.

## 2011-11-16 NOTE — Assessment & Plan Note (Signed)
We discussed age appropriate health related issues, including available/recomended screening tests and vaccinations. We discussed a need for adhering to healthy diet and exercise. Labs/EKG were reviewed/ordered. All questions were answered.  He is a blood donor

## 2011-11-16 NOTE — Progress Notes (Signed)
   Subjective:    Patient ID: Derrick Walters, male    DOB: 1960-04-07, 51 y.o.   MRN: [id number]  HPI The patient is here for a wellness exam. The patient has been doing well overall without major physical or psychological issues going on lately.  Wt Readings from Last 3 Encounters:  11/16/11 207 lb (93.895 kg)  02/17/11 206 lb (93.441 kg)  10/07/10 203 lb (92.08 kg)   BP Readings from Last 3 Encounters:  11/16/11 130/70  02/17/11 120/90  10/07/10 118/80      Review of Systems  Constitutional: Negative for appetite change, fatigue and unexpected weight change.  HENT: Negative for nosebleeds, congestion, sore throat, sneezing, trouble swallowing and neck pain.   Eyes: Negative for itching and visual disturbance.  Respiratory: Negative for cough.   Cardiovascular: Negative for chest pain, palpitations and leg swelling.  Gastrointestinal: Negative for nausea, diarrhea, blood in stool and abdominal distention.  Genitourinary: Negative for frequency and hematuria.  Musculoskeletal: Negative for back pain, joint swelling and gait problem.  Skin: Negative for rash.  Neurological: Negative for dizziness, tremors, speech difficulty and weakness.  Psychiatric/Behavioral: Negative for disturbed wake/sleep cycle, dysphoric mood and agitation. The patient is not nervous/anxious.        Objective:   Physical Exam  Constitutional: He is oriented to person, place, and time. He appears well-developed.  HENT:  Mouth/Throat: Oropharynx is clear and moist.  Eyes: Conjunctivae normal are normal. Pupils are equal, round, and reactive to light.  Neck: Normal range of motion. No JVD present. No thyromegaly present.  Cardiovascular: Normal rate, regular rhythm, normal heart sounds and intact distal pulses.  Exam reveals no gallop and no friction rub.   No murmur heard. Pulmonary/Chest: Effort normal and breath sounds normal. No respiratory distress. He has no wheezes. He has no rales. He exhibits  no tenderness.  Abdominal: Soft. Bowel sounds are normal. He exhibits no distension and no mass. There is no tenderness. There is no rebound and no guarding.  Genitourinary: Rectum normal, prostate normal and penis normal.  Musculoskeletal: Normal range of motion. He exhibits no edema and no tenderness.  Lymphadenopathy:    He has no cervical adenopathy.  Neurological: He is alert and oriented to person, place, and time. He has normal reflexes. No cranial nerve deficit. He exhibits normal muscle tone. Coordination normal.  Skin: Skin is warm and dry. No rash noted.       Mole L chest irreg  Psychiatric: He has a normal mood and affect. His behavior is normal. Judgment and thought content normal.     Lab Results  Component Value Date   WBC 4.7 11/04/2011   HGB 12.0* 11/04/2011   HCT 36.8* 11/04/2011   PLT 242.0 11/04/2011   CHOL 177 11/04/2011   TRIG 82.0 11/04/2011   HDL 29.50* 11/04/2011   ALT 13 11/04/2011   AST 16 11/04/2011   NA 140 11/04/2011   K 4.4 11/04/2011   CL 105 11/04/2011   CREATININE 1.1 11/04/2011   BUN 18 11/04/2011   CO2 29 11/04/2011   TSH 5.02 11/04/2011   PSA 1.88 11/04/2011        Assessment & Plan:

## 2011-11-17 NOTE — Assessment & Plan Note (Signed)
Skin bx was adviced

## 2012-05-11 ENCOUNTER — Other Ambulatory Visit (INDEPENDENT_AMBULATORY_CARE_PROVIDER_SITE_OTHER): Payer: BC Managed Care – PPO

## 2012-05-11 DIAGNOSIS — E039 Hypothyroidism, unspecified: Secondary | ICD-10-CM

## 2012-05-11 LAB — TSH: TSH: 0.45 u[IU]/mL (ref 0.35–5.50)

## 2012-06-25 ENCOUNTER — Other Ambulatory Visit: Payer: Self-pay | Admitting: Internal Medicine

## 2012-09-21 ENCOUNTER — Other Ambulatory Visit: Payer: Self-pay | Admitting: Internal Medicine

## 2012-10-16 ENCOUNTER — Other Ambulatory Visit: Payer: Self-pay | Admitting: Internal Medicine

## 2012-10-16 DIAGNOSIS — Z0000 Encounter for general adult medical examination without abnormal findings: Secondary | ICD-10-CM

## 2012-11-14 ENCOUNTER — Other Ambulatory Visit (INDEPENDENT_AMBULATORY_CARE_PROVIDER_SITE_OTHER): Payer: BC Managed Care – PPO

## 2012-11-14 DIAGNOSIS — Z0000 Encounter for general adult medical examination without abnormal findings: Secondary | ICD-10-CM

## 2012-11-14 LAB — URINALYSIS, ROUTINE W REFLEX MICROSCOPIC
Nitrite: NEGATIVE
Total Protein, Urine: NEGATIVE
pH: 6 (ref 5.0–8.0)

## 2012-11-14 LAB — COMPREHENSIVE METABOLIC PANEL
Albumin: 4.3 g/dL (ref 3.5–5.2)
BUN: 19 mg/dL (ref 6–23)
CO2: 30 mEq/L (ref 19–32)
GFR: 73.4 mL/min (ref >60.00)
Glucose, Bld: 98 mg/dL (ref 70–99)
Potassium: 4.6 mEq/L (ref 3.5–5.1)
Sodium: 138 mEq/L (ref 135–145)
Total Bilirubin: 0.9 mg/dL (ref 0.3–1.2)
Total Protein: 7.2 g/dL (ref 6.0–8.3)

## 2012-11-14 LAB — CBC WITH DIFFERENTIAL/PLATELET
Basophils Absolute: 0 10*3/uL (ref 0.0–0.1)
Basophils Relative: 0.6 % (ref 0.0–3.0)
Eosinophils Absolute: 0.2 10*3/uL (ref 0.0–0.7)
Eosinophils Relative: 3.3 % (ref 0.0–5.0)
HCT: 39 % (ref 39.0–52.0)
Hemoglobin: 12.9 g/dL — ABNORMAL LOW (ref 13.0–17.0)
Lymphocytes Relative: 29 % (ref 12.0–46.0)
Lymphs Abs: 1.3 10*3/uL (ref 0.7–4.0)
MCHC: 33.1 g/dL (ref 30.0–36.0)
MCV: 83 fl (ref 78.0–100.0)
Monocytes Absolute: 0.3 10*3/uL (ref 0.1–1.0)
Monocytes Relative: 6.4 % (ref 3.0–12.0)
Neutro Abs: 2.8 10*3/uL (ref 1.4–7.7)
Neutrophils Relative %: 60.7 % (ref 43.0–77.0)
Platelets: 245 10*3/uL (ref 150.0–400.0)
RBC: 4.7 Mil/uL (ref 4.22–5.81)
RDW: 18.4 % — ABNORMAL HIGH (ref 11.5–14.6)
WBC: 4.7 10*3/uL (ref 4.5–10.5)

## 2012-11-14 LAB — LIPID PANEL: Cholesterol: 186 mg/dL (ref 0–200)

## 2012-11-20 ENCOUNTER — Ambulatory Visit (INDEPENDENT_AMBULATORY_CARE_PROVIDER_SITE_OTHER): Payer: BC Managed Care – PPO | Admitting: Internal Medicine

## 2012-11-20 ENCOUNTER — Encounter: Payer: Self-pay | Admitting: Internal Medicine

## 2012-11-20 VITALS — BP 122/82 | HR 76 | Temp 97.3°F | Resp 12 | Ht 73.0 in | Wt 200.0 lb

## 2012-11-20 DIAGNOSIS — D649 Anemia, unspecified: Secondary | ICD-10-CM

## 2012-11-20 DIAGNOSIS — E039 Hypothyroidism, unspecified: Secondary | ICD-10-CM

## 2012-11-20 DIAGNOSIS — Z0000 Encounter for general adult medical examination without abnormal findings: Secondary | ICD-10-CM

## 2012-11-20 MED ORDER — LEVOTHYROXINE SODIUM 175 MCG PO TABS: 175.0000 ug | ORAL_TABLET | Freq: Every day | ORAL | Status: DC

## 2012-11-20 NOTE — Addendum Note (Signed)
Addended by: Tresa Garter on: 11/20/2012 04:05 PM   Modules accepted: Orders

## 2012-11-20 NOTE — Assessment & Plan Note (Signed)
We discussed age appropriate health related issues, including available/recomended screening tests and vaccinations. We discussed a need for adhering to healthy diet and exercise. Labs/EKG were reviewed/ordered. All questions were answered.   

## 2012-11-20 NOTE — Assessment & Plan Note (Addendum)
Labs On [place]; not giving blood lately

## 2012-11-20 NOTE — Progress Notes (Signed)
   Subjective:    Patient ID: Derrick Walters, male    DOB: 1960/04/07, 52 y.o.   MRN: [id number]  HPI The patient is here for a wellness exam.   The patient has been doing well overall without major physical or psychological issues going on lately.  Wt Readings from Last 3 Encounters:  11/20/12 200 lb (90.719 kg)  11/16/11 207 lb (93.895 kg)  02/17/11 206 lb (93.441 kg)   BP Readings from Last 3 Encounters:  11/20/12 122/82  11/16/11 130/70  02/17/11 120/90      Review of Systems  Constitutional: Negative for appetite change, fatigue and unexpected weight change.  HENT: Negative for nosebleeds, congestion, sore throat, sneezing, trouble swallowing and neck pain.   Eyes: Negative for itching and visual disturbance.  Respiratory: Negative for cough.   Cardiovascular: Negative for chest pain, palpitations and leg swelling.  Gastrointestinal: Negative for nausea, diarrhea, blood in stool and abdominal distention.  Genitourinary: Negative for frequency and hematuria.  Musculoskeletal: Negative for back pain, joint swelling and gait problem.  Skin: Negative for rash.  Neurological: Negative for dizziness, tremors, speech difficulty and weakness.  Psychiatric/Behavioral: Negative for sleep disturbance, dysphoric mood and agitation. The patient is not nervous/anxious.        Objective:   Physical Exam  Constitutional: He is oriented to person, place, and time. He appears well-developed.  HENT:  Mouth/Throat: Oropharynx is clear and moist.  Eyes: Conjunctivae are normal. Pupils are equal, round, and reactive to light.  Neck: Normal range of motion. No JVD present. No thyromegaly present.  Cardiovascular: Normal rate, regular rhythm, normal heart sounds and intact distal pulses.  Exam reveals no gallop and no friction rub.   No murmur heard. Pulmonary/Chest: Effort normal and breath sounds normal. No respiratory distress. He has no wheezes. He has no rales. He exhibits no  tenderness.  Abdominal: Soft. Bowel sounds are normal. He exhibits no distension and no mass. There is no tenderness. There is no rebound and no guarding.  Genitourinary: Rectum normal, prostate normal and penis normal.  Musculoskeletal: Normal range of motion. He exhibits no edema and no tenderness.  Lymphadenopathy:    He has no cervical adenopathy.  Neurological: He is alert and oriented to person, place, and time. He has normal reflexes. No cranial nerve deficit. He exhibits normal muscle tone. Coordination normal.  Skin: Skin is warm and dry. No rash noted.  Mole L chest irreg  Psychiatric: He has a normal mood and affect. His behavior is normal. Judgment and thought content normal.     Lab Results  Component Value Date   WBC 4.7 11/14/2012   HGB 12.9* 11/14/2012   HCT 39.0 11/14/2012   PLT 245.0 11/14/2012   CHOL 186 11/14/2012   TRIG 78.0 11/14/2012   HDL 31.50* 11/14/2012   ALT 15 11/14/2012   AST 18 11/14/2012   NA 138 11/14/2012   K 4.6 11/14/2012   CL 103 11/14/2012   CREATININE 1.1 11/14/2012   BUN 19 11/14/2012   CO2 30 11/14/2012   TSH 5.30 11/14/2012   PSA 1.79 11/14/2012        Assessment & Plan:

## 2012-11-20 NOTE — Assessment & Plan Note (Signed)
Adjusted the dose

## 2013-01-19 ENCOUNTER — Other Ambulatory Visit (INDEPENDENT_AMBULATORY_CARE_PROVIDER_SITE_OTHER): Payer: BC Managed Care – PPO

## 2013-01-19 DIAGNOSIS — D649 Anemia, unspecified: Secondary | ICD-10-CM

## 2013-01-19 DIAGNOSIS — E039 Hypothyroidism, unspecified: Secondary | ICD-10-CM

## 2013-01-19 LAB — IBC PANEL
Iron: 133 ug/dL (ref 42–165)
Saturation Ratios: 31.2 % (ref 20.0–50.0)
Transferrin: 304.1 mg/dL (ref 212.0–360.0)

## 2013-01-19 LAB — CBC WITH DIFFERENTIAL/PLATELET
Basophils Relative: 0.7 % (ref 0.0–3.0)
Eosinophils Relative: 2.6 % (ref 0.0–5.0)
Lymphocytes Relative: 33 % (ref 12.0–46.0)
Lymphs Abs: 1.7 10*3/uL (ref 0.7–4.0)
MCV: 85.7 fl (ref 78.0–100.0)
Monocytes Absolute: 0.3 10*3/uL (ref 0.1–1.0)
Monocytes Relative: 6.4 % (ref 3.0–12.0)
Neutrophils Relative %: 57.3 % (ref 43.0–77.0)
Platelets: 270 10*3/uL (ref 150.0–400.0)
RBC: 4.68 Mil/uL (ref 4.22–5.81)
WBC: 5 10*3/uL (ref 4.5–10.5)

## 2013-01-19 LAB — TSH: TSH: 0.63 u[IU]/mL (ref 0.35–5.50)

## 2013-04-20 ENCOUNTER — Encounter: Payer: BC Managed Care – PPO | Admitting: Physical Therapy

## 2013-08-14 ENCOUNTER — Encounter: Payer: Self-pay | Admitting: Internal Medicine

## 2013-08-14 ENCOUNTER — Ambulatory Visit (INDEPENDENT_AMBULATORY_CARE_PROVIDER_SITE_OTHER): Payer: BC Managed Care – PPO | Admitting: Internal Medicine

## 2013-08-14 VITALS — BP 120/86 | HR 79 | Temp 98.1°F | Ht 73.0 in | Wt 202.2 lb

## 2013-08-14 DIAGNOSIS — IMO0001 Reserved for inherently not codable concepts without codable children: Secondary | ICD-10-CM

## 2013-08-14 DIAGNOSIS — W57XXXA Bitten or stung by nonvenomous insect and other nonvenomous arthropods, initial encounter: Secondary | ICD-10-CM | POA: Insufficient documentation

## 2013-08-14 MED ORDER — DOXYCYCLINE HYCLATE 100 MG PO TABS: 100.0000 mg | ORAL_TABLET | Freq: Two times a day (BID) | ORAL | Status: DC

## 2013-08-14 NOTE — Progress Notes (Signed)
Pre visit review using our clinic review tool, if applicable. No additional management support is needed unless otherwise documented below in the visit note. 

## 2013-08-14 NOTE — Patient Instructions (Signed)
Please take all new medication as prescribed  Please continue all other medications as before, and refills have been done if requested.  Please have the pharmacy call with any other refills you may need.  Please continue your efforts at being more active, low cholesterol diet, and weight control.

## 2013-08-14 NOTE — Progress Notes (Signed)
   Subjective:    Patient ID: Derrick Walters, male    DOB: 01/16/1961, 53 y.o.   MRN: [id number]  HPI  Here with recent tick bites with ticks self tremoved from right ant mid arm, and right mid abd , with the arm site now red, tender, swelling without red streaks or drainage or fever.  No other rash, no hx of tick related illness.  Has ongoing exposure to mult tick in [place]  Pt denies chest pain, increased sob or doe, wheezing, orthopnea, PND, increased LE swelling, palpitations, dizziness or syncope. Past Medical History  Diagnosis Date  . Hypothyroidism   . History of nephrolithiasis   . Blood donor     q 2-3 mo  . Hyperlipidemia    No past surgical history on file.  reports that he has never smoked. He does not have any smokeless tobacco history on file. He reports that he does not drink alcohol or use illicit drugs. family history includes COPD in his mother; Cancer (age of onset: 54) in his mother; Depression in his brother and father. No Known Allergies Current Outpatient Prescriptions on File Prior to Visit  Medication Sig Dispense Refill  . acyclovir (ZOVIRAX) 200 MG capsule Take 200 mg by mouth daily.        Marland Kitchen aspirin 81 MG EC tablet Take 81 mg by mouth daily.        . Cholecalciferol 1000 UNITS tablet Take 1,000 Units by mouth daily.        Marland Kitchen levothyroxine (SYNTHROID) 175 MCG tablet Take 1 tablet (175 mcg total) by mouth daily before breakfast.  90 tablet  3  . fish oil-omega-3 fatty acids 1000 MG capsule Take 2 g by mouth daily.       No current facility-administered medications on file prior to visit.   Review of Systems All otherwise neg per pt     Objective:   Physical Exam BP 120/86  Pulse 79  Temp(Src) 98.1 F (36.7 C) (Oral)  Ht 6\' 1"  (1.854 m)  Wt 202 lb 4 oz (91.74 kg)  BMI 26.69 kg/m2  SpO2 97% VS noted,  Constitutional: Pt appears well-developed, well-nourished.  HENT: Head: NCAT.  Right Ear: External ear normal.  Left Ear:  External ear normal.  Eyes: . Pupils are equal, round, and reactive to light. Conjunctivae and EOM are normal Neck: Normal range of motion. Neck supple.  Cardiovascular: Normal rate and regular rhythm.   Pulmonary/Chest: Effort normal and breath sounds normal.  Neurological: Pt is alert. Not confused , motor grossly intact Skin: right mid ant arm with approx 2.5 cm area red/tender/swelling without red streaks, drainage Psychiatric: Pt behavior is normal. No agitation.        Assessment & Plan:

## 2013-08-19 NOTE — Assessment & Plan Note (Signed)
Mild to mod, for antibx course,  to f/u any worsening symptoms or concerns 

## 2013-11-17 ENCOUNTER — Other Ambulatory Visit: Payer: Self-pay | Admitting: Internal Medicine

## 2013-12-05 ENCOUNTER — Other Ambulatory Visit: Payer: BC Managed Care – PPO

## 2013-12-12 ENCOUNTER — Encounter: Payer: Self-pay | Admitting: Internal Medicine

## 2013-12-12 ENCOUNTER — Other Ambulatory Visit (INDEPENDENT_AMBULATORY_CARE_PROVIDER_SITE_OTHER): Payer: BC Managed Care – PPO

## 2013-12-12 ENCOUNTER — Ambulatory Visit (INDEPENDENT_AMBULATORY_CARE_PROVIDER_SITE_OTHER): Payer: BC Managed Care – PPO | Admitting: Internal Medicine

## 2013-12-12 VITALS — BP 136/98 | HR 85 | Temp 97.9°F | Ht 73.0 in | Wt 197.0 lb

## 2013-12-12 DIAGNOSIS — Z0000 Encounter for general adult medical examination without abnormal findings: Secondary | ICD-10-CM

## 2013-12-12 DIAGNOSIS — E038 Other specified hypothyroidism: Secondary | ICD-10-CM

## 2013-12-12 DIAGNOSIS — E034 Atrophy of thyroid (acquired): Secondary | ICD-10-CM

## 2013-12-12 DIAGNOSIS — Z23 Encounter for immunization: Secondary | ICD-10-CM

## 2013-12-12 LAB — CBC WITH DIFFERENTIAL/PLATELET
BASOS ABS: 0 10*3/uL (ref 0.0–0.1)
Basophils Relative: 0.3 % (ref 0.0–3.0)
Eosinophils Absolute: 0.1 10*3/uL (ref 0.0–0.7)
Eosinophils Relative: 1.2 % (ref 0.0–5.0)
HCT: 43.2 % (ref 39.0–52.0)
Hemoglobin: 14.6 g/dL (ref 13.0–17.0)
Lymphocytes Relative: 20.8 % (ref 12.0–46.0)
Lymphs Abs: 1.7 10*3/uL (ref 0.7–4.0)
MCHC: 33.8 g/dL (ref 30.0–36.0)
MCV: 92.6 fl (ref 78.0–100.0)
MONO ABS: 0.4 10*3/uL (ref 0.1–1.0)
Monocytes Relative: 4.6 % (ref 3.0–12.0)
NEUTROS PCT: 73.1 % (ref 43.0–77.0)
Neutro Abs: 6 10*3/uL (ref 1.4–7.7)
PLATELETS: 228 10*3/uL (ref 150.0–400.0)
RBC: 4.67 Mil/uL (ref 4.22–5.81)
RDW: 12.6 % (ref 11.5–15.5)
WBC: 8.1 10*3/uL (ref 4.0–10.5)

## 2013-12-12 LAB — URINALYSIS
BILIRUBIN URINE: NEGATIVE
HGB URINE DIPSTICK: NEGATIVE
Ketones, ur: NEGATIVE
Leukocytes, UA: NEGATIVE
NITRITE: NEGATIVE
Specific Gravity, Urine: 1.025 (ref 1.000–1.030)
Total Protein, Urine: NEGATIVE
URINE GLUCOSE: NEGATIVE
Urobilinogen, UA: 0.2 (ref 0.0–1.0)
pH: 6 (ref 5.0–8.0)

## 2013-12-12 NOTE — Assessment & Plan Note (Signed)
We discussed age appropriate health related issues, including available/recomended screening tests and vaccinations. We discussed a need for adhering to healthy diet and exercise. Labs/EKG were reviewed/ordered. All questions were answered. Colon due 2021 Labs

## 2013-12-12 NOTE — Progress Notes (Signed)
   Subjective:    HPI   The patient is here for a wellness exam.  The patient has been doing well overall without major physical or psychological issues going on lately. He is divorced now, has a girlfriend.   Wt Readings from Last 3 Encounters:  12/12/13 197 lb (89.359 kg)  08/14/13 202 lb 4 oz (91.74 kg)  11/20/12 200 lb (90.719 kg)   BP Readings from Last 3 Encounters:  12/12/13 136/98  08/14/13 120/86  11/20/12 122/82      Review of Systems  Constitutional: Negative for appetite change, fatigue and unexpected weight change.  HENT: Negative for congestion, nosebleeds, sneezing, sore throat and trouble swallowing.   Eyes: Negative for itching and visual disturbance.  Respiratory: Negative for cough.   Cardiovascular: Negative for chest pain, palpitations and leg swelling.  Gastrointestinal: Negative for nausea, diarrhea, blood in stool and abdominal distention.  Genitourinary: Negative for frequency and hematuria.  Musculoskeletal: Negative for back pain, gait problem, joint swelling and neck pain.  Skin: Negative for rash.  Neurological: Negative for dizziness, tremors, speech difficulty and weakness.  Psychiatric/Behavioral: Negative for sleep disturbance, dysphoric mood and agitation. The patient is not nervous/anxious.        Objective:   Physical Exam  Constitutional: He is oriented to person, place, and time. He appears well-developed.  HENT:  Mouth/Throat: Oropharynx is clear and moist.  Eyes: Conjunctivae are normal. Pupils are equal, round, and reactive to light.  Neck: Normal range of motion. No JVD present. No thyromegaly present.  Cardiovascular: Normal rate, regular rhythm, normal heart sounds and intact distal pulses.  Exam reveals no gallop and no friction rub.   No murmur heard. Pulmonary/Chest: Effort normal and breath sounds normal. No respiratory distress. He has no wheezes. He has no rales. He exhibits no tenderness.  Abdominal: Soft. Bowel  sounds are normal. He exhibits no distension and no mass. There is no tenderness. There is no rebound and no guarding.  Musculoskeletal: Normal range of motion. He exhibits no edema and no tenderness.  Lymphadenopathy:    He has no cervical adenopathy.  Neurological: He is alert and oriented to person, place, and time. He has normal reflexes. No cranial nerve deficit. He exhibits normal muscle tone. Coordination normal.  Skin: Skin is warm and dry. No rash noted.  Mole L chest irreg  Psychiatric: He has a normal mood and affect. His behavior is normal. Judgment and thought content normal.  Rectal - just had it done by Dr Diona Fanti   Lab Results  Component Value Date   WBC 5.0 01/19/2013   HGB 13.7 01/19/2013   HCT 40.1 01/19/2013   PLT 270.0 01/19/2013   CHOL 186 11/14/2012   TRIG 78.0 11/14/2012   HDL 31.50* 11/14/2012   ALT 15 11/14/2012   AST 18 11/14/2012   NA 138 11/14/2012   K 4.6 11/14/2012   CL 103 11/14/2012   CREATININE 1.1 11/14/2012   BUN 19 11/14/2012   CO2 30 11/14/2012   TSH 0.63 01/19/2013   PSA 1.79 11/14/2012        Assessment & Plan:

## 2013-12-12 NOTE — Progress Notes (Signed)
Pre visit review using our clinic review tool, if applicable. No additional management support is needed unless otherwise documented below in the visit note. 

## 2013-12-12 NOTE — Assessment & Plan Note (Signed)
Continue with current prescription therapy as reflected on the Med list.  

## 2013-12-13 LAB — LIPID PANEL
Cholesterol: 165 mg/dL (ref 0–200)
HDL: 28.2 mg/dL — AB (ref >39.00)
LDL Cholesterol: 116 mg/dL — ABNORMAL HIGH (ref 0–99)
NonHDL: 136.8
TRIGLYCERIDES: 102 mg/dL (ref 0.0–149.0)
Total CHOL/HDL Ratio: 6
VLDL: 20.4 mg/dL (ref 0.0–40.0)

## 2013-12-13 LAB — BASIC METABOLIC PANEL
BUN: 17 mg/dL (ref 6–23)
CO2: 30 meq/L (ref 19–32)
CREATININE: 1.1 mg/dL (ref 0.4–1.5)
Calcium: 9.8 mg/dL (ref 8.4–10.5)
Chloride: 104 mEq/L (ref 96–112)
GFR: 71.62 mL/min (ref >60.00)
Glucose, Bld: 100 mg/dL — ABNORMAL HIGH (ref 70–99)
Potassium: 3.9 mEq/L (ref 3.5–5.1)
Sodium: 140 mEq/L (ref 135–145)

## 2013-12-13 LAB — TSH: TSH: 0.27 u[IU]/mL — ABNORMAL LOW (ref 0.35–4.50)

## 2013-12-13 LAB — HEPATIC FUNCTION PANEL
ALT: 14 U/L (ref 0–53)
AST: 17 U/L (ref 0–37)
Albumin: 4.2 g/dL (ref 3.5–5.2)
Alkaline Phosphatase: 69 U/L (ref 39–117)
BILIRUBIN TOTAL: 0.8 mg/dL (ref 0.2–1.2)
Bilirubin, Direct: 0.1 mg/dL (ref 0.0–0.3)
Total Protein: 7.7 g/dL (ref 6.0–8.3)

## 2013-12-13 LAB — PSA: PSA: 1.64 ng/mL (ref 0.10–4.00)

## 2013-12-17 ENCOUNTER — Other Ambulatory Visit: Payer: Self-pay | Admitting: *Deleted

## 2013-12-17 DIAGNOSIS — E039 Hypothyroidism, unspecified: Secondary | ICD-10-CM

## 2014-03-15 ENCOUNTER — Other Ambulatory Visit (INDEPENDENT_AMBULATORY_CARE_PROVIDER_SITE_OTHER): Payer: Self-pay

## 2014-03-15 DIAGNOSIS — E039 Hypothyroidism, unspecified: Secondary | ICD-10-CM

## 2014-03-15 LAB — TSH: TSH: 0.12 u[IU]/mL — ABNORMAL LOW (ref 0.35–4.50)

## 2014-03-16 ENCOUNTER — Other Ambulatory Visit: Payer: Self-pay | Admitting: Internal Medicine

## 2014-03-16 DIAGNOSIS — E034 Atrophy of thyroid (acquired): Secondary | ICD-10-CM

## 2014-06-07 ENCOUNTER — Other Ambulatory Visit (INDEPENDENT_AMBULATORY_CARE_PROVIDER_SITE_OTHER): Payer: Self-pay

## 2014-06-07 DIAGNOSIS — E034 Atrophy of thyroid (acquired): Secondary | ICD-10-CM

## 2014-06-07 DIAGNOSIS — E038 Other specified hypothyroidism: Secondary | ICD-10-CM

## 2014-06-07 LAB — T4, FREE: Free T4: 1.5 ng/dL (ref 0.60–1.60)

## 2014-06-07 LAB — TSH: TSH: 0.04 u[IU]/mL — ABNORMAL LOW (ref 0.35–4.50)

## 2014-08-30 ENCOUNTER — Encounter: Payer: Self-pay | Admitting: Internal Medicine

## 2014-08-30 ENCOUNTER — Ambulatory Visit (INDEPENDENT_AMBULATORY_CARE_PROVIDER_SITE_OTHER): Payer: BLUE CROSS/BLUE SHIELD | Admitting: Internal Medicine

## 2014-08-30 VITALS — BP 130/82 | HR 70 | Temp 98.0°F | Resp 16 | Wt 186.0 lb

## 2014-08-30 DIAGNOSIS — J310 Chronic rhinitis: Secondary | ICD-10-CM

## 2014-08-30 DIAGNOSIS — R05 Cough: Secondary | ICD-10-CM | POA: Diagnosis not present

## 2014-08-30 DIAGNOSIS — R059 Cough, unspecified: Secondary | ICD-10-CM

## 2014-08-30 NOTE — Progress Notes (Signed)
Pre visit review using our clinic review tool, if applicable. No additional management support is needed unless otherwise documented below in the visit note. 

## 2014-08-30 NOTE — Patient Instructions (Signed)
To use Breo: Pull cap down to release medication. Blow out as much as possible then inhale powder as deeply as possible. Hold breath to count of ten then exhale.  Gargle and spit after use. Lot #: [id number] Expiration date:03/2016   Plain Mucinex (NOT D) for thick secretions ;force NON dairy fluids .   Nasal cleansing in the shower as discussed with lather of mild shampoo.After 10 seconds wash off lather while  exhaling through nostrils. Make sure that all residual soap is removed to prevent irritation.  Flonase OR Nasacort AQ 1 spray in each nostril twice a day as needed. Use the "crossover" technique into opposite nostril spraying toward opposite ear @ 45 degree angle, not straight up into nostril.  Plain Allegra (NOT D )  160 daily , Loratidine 10 mg , OR Zyrtec 10 mg @ bedtime  as needed for itchy eyes & sneezing.

## 2014-08-30 NOTE — Progress Notes (Signed)
   Subjective:    Patient ID: Derrick Walters, male    DOB: 12-Apr-1960, 54 y.o.   MRN: [id number]  HPI He's had a cough for 3 months which he attributes to "allergies". It is worse in the evening; it is not awakening him at night. It affect his sleep @ night because of the paroxysmal cough . At times he will produce clear sputum. He used an  over-the-counter allergy pill which was of some benefit.  He questions possible wheezing.  He's never smoked and has no history of asthma. He does have a pet dog in the home environment  He denies any upper respiratory tract infection symptoms or reflux symptoms as components.   Review of Systems Frontal headache, facial pain , nasal purulence, dental pain, sore throat , otic pain or otic discharge denied. No fever , chills or sweats. Unexplained weight loss, abdominal pain, significant dyspepsia, dysphagia, melena, rectal bleeding, or persistently small caliber stools are denied.    Objective:   Physical Exam  General appearance:Adequately nourished; no acute distress or increased work of breathing is present.    Lymphatic: No  lymphadenopathy about the head, neck, or axilla .  Eyes: No conjunctival inflammation or lid edema is present. There is no scleral icterus.  Ears:  External ear exam shows no significant lesions or deformities.  Otoscopic examination reveals clear canals, tympanic membranes are intact bilaterally without bulging, retraction, inflammation or discharge.  Nose:  External nasal examination shows no deformity or inflammation. Nasal mucosa are markedly erythematous without lesions or exudates No septal dislocation or deviation.No obstruction to airflow.   Oral exam: Dental hygiene is good; lips and gums are healthy appearing.There is no oropharyngeal erythema or exudate .  Neck:  No deformities, thyromegaly, masses, or tenderness noted.   Supple with full range of motion without pain.   Heart:  Normal rate and regular rhythm.  S1 and S2 normal without gallop, murmur, click, rub or other extra sounds.   Lungs:Chest clear to auscultation; no wheezes, rhonchi,rales ,or rubs present.  Extremities:  No cyanosis, edema, or clubbing  noted    Skin: Warm & dry w/o tenting or jaundice. No significant lesions or rash.        Assessment & Plan:  #1 non allergic rhinitis #2 cough See Orders & AVS

## 2014-11-05 ENCOUNTER — Other Ambulatory Visit: Payer: Self-pay | Admitting: *Deleted

## 2014-11-05 MED ORDER — LEVOTHYROXINE SODIUM 175 MCG PO TABS: 175.0000 ug | ORAL_TABLET | Freq: Every day | ORAL | Status: DC

## 2014-11-29 ENCOUNTER — Encounter: Payer: Self-pay | Admitting: Internal Medicine

## 2014-12-02 ENCOUNTER — Other Ambulatory Visit: Payer: Self-pay | Admitting: Internal Medicine

## 2014-12-02 DIAGNOSIS — Z0000 Encounter for general adult medical examination without abnormal findings: Secondary | ICD-10-CM

## 2014-12-06 ENCOUNTER — Other Ambulatory Visit (INDEPENDENT_AMBULATORY_CARE_PROVIDER_SITE_OTHER): Payer: BLUE CROSS/BLUE SHIELD

## 2014-12-06 DIAGNOSIS — Z0000 Encounter for general adult medical examination without abnormal findings: Secondary | ICD-10-CM | POA: Diagnosis not present

## 2014-12-06 LAB — BASIC METABOLIC PANEL
BUN: 13 mg/dL (ref 6–23)
CO2: 30 mEq/L (ref 19–32)
Calcium: 9.5 mg/dL (ref 8.4–10.5)
Chloride: 103 mEq/L (ref 96–112)
Creatinine, Ser: 1.13 mg/dL (ref 0.40–1.50)
GFR: 71.36 mL/min (ref >60.00)
Glucose, Bld: 93 mg/dL (ref 70–99)
POTASSIUM: 4.3 meq/L (ref 3.5–5.1)
SODIUM: 139 meq/L (ref 135–145)

## 2014-12-06 LAB — HEPATIC FUNCTION PANEL
ALT: 9 U/L (ref 0–53)
AST: 11 U/L (ref 0–37)
Albumin: 4.3 g/dL (ref 3.5–5.2)
Alkaline Phosphatase: 75 U/L (ref 39–117)
BILIRUBIN TOTAL: 0.9 mg/dL (ref 0.2–1.2)
Bilirubin, Direct: 0.2 mg/dL (ref 0.0–0.3)
Total Protein: 7 g/dL (ref 6.0–8.3)

## 2014-12-06 LAB — URINALYSIS
BILIRUBIN URINE: NEGATIVE
HGB URINE DIPSTICK: NEGATIVE
KETONES UR: NEGATIVE
Leukocytes, UA: NEGATIVE
Nitrite: NEGATIVE
Specific Gravity, Urine: 1.015 (ref 1.000–1.030)
Total Protein, Urine: NEGATIVE
URINE GLUCOSE: NEGATIVE
Urobilinogen, UA: 0.2 (ref 0.0–1.0)
pH: 6 (ref 5.0–8.0)

## 2014-12-06 LAB — CBC WITH DIFFERENTIAL/PLATELET
Basophils Absolute: 0 10*3/uL (ref 0.0–0.1)
Basophils Relative: 0.7 % (ref 0.0–3.0)
EOS PCT: 2.5 % (ref 0.0–5.0)
Eosinophils Absolute: 0.1 10*3/uL (ref 0.0–0.7)
HCT: 39 % (ref 39.0–52.0)
HEMOGLOBIN: 13 g/dL (ref 13.0–17.0)
LYMPHS PCT: 29.6 % (ref 12.0–46.0)
Lymphs Abs: 1.3 10*3/uL (ref 0.7–4.0)
MCHC: 33.3 g/dL (ref 30.0–36.0)
MCV: 87.4 fl (ref 78.0–100.0)
MONO ABS: 0.4 10*3/uL (ref 0.1–1.0)
MONOS PCT: 8.6 % (ref 3.0–12.0)
Neutro Abs: 2.5 10*3/uL (ref 1.4–7.7)
Neutrophils Relative %: 58.6 % (ref 43.0–77.0)
Platelets: 261 10*3/uL (ref 150.0–400.0)
RBC: 4.46 Mil/uL (ref 4.22–5.81)
RDW: 14.4 % (ref 11.5–15.5)
WBC: 4.3 10*3/uL (ref 4.0–10.5)

## 2014-12-06 LAB — PSA: PSA: 1.58 ng/mL (ref 0.10–4.00)

## 2014-12-06 LAB — TSH: TSH: 0.1 u[IU]/mL — AB (ref 0.35–4.50)

## 2014-12-06 LAB — LIPID PANEL
CHOL/HDL RATIO: 5
CHOLESTEROL: 161 mg/dL (ref 0–200)
HDL: 34.9 mg/dL — ABNORMAL LOW (ref >39.00)
LDL CALC: 111 mg/dL — AB (ref 0–99)
NonHDL: 126.28
Triglycerides: 76 mg/dL (ref 0.0–149.0)
VLDL: 15.2 mg/dL (ref 0.0–40.0)

## 2014-12-19 ENCOUNTER — Encounter: Payer: Self-pay | Admitting: Internal Medicine

## 2014-12-19 ENCOUNTER — Ambulatory Visit (INDEPENDENT_AMBULATORY_CARE_PROVIDER_SITE_OTHER): Payer: BLUE CROSS/BLUE SHIELD | Admitting: Internal Medicine

## 2014-12-19 VITALS — BP 130/85 | HR 63 | Ht 73.0 in | Wt 184.0 lb

## 2014-12-19 DIAGNOSIS — Z23 Encounter for immunization: Secondary | ICD-10-CM

## 2014-12-19 DIAGNOSIS — E038 Other specified hypothyroidism: Secondary | ICD-10-CM | POA: Diagnosis not present

## 2014-12-19 DIAGNOSIS — E034 Atrophy of thyroid (acquired): Secondary | ICD-10-CM | POA: Diagnosis not present

## 2014-12-19 DIAGNOSIS — Z0000 Encounter for general adult medical examination without abnormal findings: Secondary | ICD-10-CM | POA: Diagnosis not present

## 2014-12-19 MED ORDER — LEVOTHYROXINE SODIUM 175 MCG PO TABS: 175.0000 ug | ORAL_TABLET | Freq: Every day | ORAL | Status: DC

## 2014-12-19 NOTE — Assessment & Plan Note (Signed)
We discussed age appropriate health related issues, including available/recomended screening tests and vaccinations. We discussed a need for adhering to healthy diet and exercise. Labs/EKG were reviewed/ordered. All questions were answered. Colon due 2021 

## 2014-12-19 NOTE — Progress Notes (Signed)
Pre visit review using our clinic review tool, if applicable. No additional management support is needed unless otherwise documented below in the visit note. 

## 2014-12-19 NOTE — Assessment & Plan Note (Signed)
TSH, FT3, FT4 in 6 mo

## 2014-12-19 NOTE — Progress Notes (Signed)
Subjective:  Patient ID: Derrick Walters, male    DOB: 1960-03-23  Age: 54 y.o. MRN: [id number]  CC: No chief complaint on file.   HPI Derrick Walters presents for a well exam   Outpatient Prescriptions Prior to Visit  Medication Sig Dispense Refill  . acyclovir (ZOVIRAX) 200 MG capsule Take 200 mg by mouth daily.      Marland Kitchen aspirin 81 MG EC tablet Take 81 mg by mouth daily.      . Cholecalciferol 1000 UNITS tablet Take 1,000 Units by mouth daily.      Marland Kitchen levothyroxine (SYNTHROID) 175 MCG tablet Take 1 tablet (175 mcg total) by mouth daily before breakfast. 90 tablet 0  . fish oil-omega-3 fatty acids 1000 MG capsule Take 2 g by mouth daily.     No facility-administered medications prior to visit.    ROS Review of Systems  Constitutional: Negative for appetite change, fatigue and unexpected weight change.  HENT: Negative for congestion, facial swelling, nosebleeds, sneezing, sore throat and trouble swallowing.   Eyes: Negative for itching and visual disturbance.  Respiratory: Negative for cough.   Cardiovascular: Negative for chest pain, palpitations and leg swelling.  Gastrointestinal: Negative for nausea, diarrhea, blood in stool and abdominal distention.  Genitourinary: Negative for frequency and hematuria.  Musculoskeletal: Negative for back pain, joint swelling, gait problem and neck pain.  Skin: Negative for rash.  Neurological: Negative for dizziness, tremors, speech difficulty and weakness.  Psychiatric/Behavioral: Negative for suicidal ideas, sleep disturbance, dysphoric mood and agitation. The patient is not nervous/anxious.     Objective:  BP 130/85 mmHg  Pulse 63  Ht 6\' 1"  (1.854 m)  Wt 184 lb (83.462 kg)  BMI 24.28 kg/m2  SpO2 97%  BP Readings from Last 3 Encounters:  12/19/14 130/85  08/30/14 130/82  12/12/13 136/98    Wt Readings from Last 3 Encounters:  12/19/14 184 lb (83.462 kg)  08/30/14 186 lb (84.369 kg)  12/12/13 197 lb (89.359 kg)    Physical  Exam  Constitutional: He is oriented to person, place, and time. He appears well-developed and well-nourished. No distress.  HENT:  Head: Normocephalic and atraumatic.  Right Ear: External ear normal.  Left Ear: External ear normal.  Nose: Nose normal.  Mouth/Throat: Oropharynx is clear and moist. No oropharyngeal exudate.  Eyes: Conjunctivae and EOM are normal. Pupils are equal, round, and reactive to light. Right eye exhibits no discharge. Left eye exhibits no discharge. No scleral icterus.  Neck: Normal range of motion. Neck supple. No JVD present. No tracheal deviation present. No thyromegaly present.  Cardiovascular: Normal rate, regular rhythm, normal heart sounds and intact distal pulses.  Exam reveals no gallop and no friction rub.   No murmur heard. Pulmonary/Chest: Effort normal and breath sounds normal. No stridor. No respiratory distress. He has no wheezes. He has no rales. He exhibits no tenderness.  Abdominal: Soft. Bowel sounds are normal. He exhibits no distension and no mass. There is no tenderness. There is no rebound and no guarding.  Genitourinary: Rectum normal, prostate normal and penis normal. Guaiac negative stool. No penile tenderness.  Musculoskeletal: Normal range of motion. He exhibits no edema or tenderness.  Lymphadenopathy:    He has no cervical adenopathy.  Neurological: He is alert and oriented to person, place, and time. He has normal reflexes. No cranial nerve deficit. He exhibits normal muscle tone. Coordination normal.  Skin: Skin is warm and dry. No rash noted. He is not diaphoretic. No erythema. No  pallor.  Psychiatric: He has a normal mood and affect. His behavior is normal. Judgment and thought content normal.    Lab Results  Component Value Date   WBC 4.3 12/06/2014   HGB 13.0 12/06/2014   HCT 39.0 12/06/2014   PLT 261.0 12/06/2014   GLUCOSE 93 12/06/2014   CHOL 161 12/06/2014   TRIG 76.0 12/06/2014   HDL 34.90* 12/06/2014   LDLCALC 111*  12/06/2014   ALT 9 12/06/2014   AST 11 12/06/2014   NA 139 12/06/2014   K 4.3 12/06/2014   CL 103 12/06/2014   CREATININE 1.13 12/06/2014   BUN 13 12/06/2014   CO2 30 12/06/2014   TSH 0.10* 12/06/2014   PSA 1.58 12/06/2014    No results found.  Assessment & Plan:   Diagnoses and all orders for this visit:  Hypothyroidism due to acquired atrophy of thyroid -     T4, free; Future -     TSH; Future -     T3, free; Future  Well adult exam  Need for influenza vaccination -     Flu Vaccine QUAD 36+ mos IM  Other orders -     levothyroxine (SYNTHROID) 175 MCG tablet; Take 1 tablet (175 mcg total) by mouth daily before breakfast.   I am having Mr. Corales maintain his acyclovir, aspirin, Cholecalciferol, fish oil-omega-3 fatty acids, and levothyroxine.  Meds ordered this encounter  Medications  . levothyroxine (SYNTHROID) 175 MCG tablet    Sig: Take 1 tablet (175 mcg total) by mouth daily before breakfast.    Dispense:  90 tablet    Refill:  3     Follow-up: Return in about 1 year (around 12/19/2015) for Wellness Exam.  Walker Kehr, MD

## 2015-02-04 ENCOUNTER — Other Ambulatory Visit: Payer: Self-pay | Admitting: Internal Medicine

## 2015-05-26 ENCOUNTER — Other Ambulatory Visit: Payer: Self-pay | Admitting: Internal Medicine

## 2015-05-26 ENCOUNTER — Encounter: Payer: Self-pay | Admitting: Internal Medicine

## 2015-05-26 DIAGNOSIS — Z0000 Encounter for general adult medical examination without abnormal findings: Secondary | ICD-10-CM

## 2015-05-27 ENCOUNTER — Other Ambulatory Visit: Payer: BLUE CROSS/BLUE SHIELD

## 2015-06-12 ENCOUNTER — Other Ambulatory Visit (INDEPENDENT_AMBULATORY_CARE_PROVIDER_SITE_OTHER): Payer: BLUE CROSS/BLUE SHIELD

## 2015-06-12 DIAGNOSIS — Z0000 Encounter for general adult medical examination without abnormal findings: Secondary | ICD-10-CM | POA: Diagnosis not present

## 2015-06-12 LAB — BASIC METABOLIC PANEL
BUN: 18 mg/dL (ref 6–23)
CO2: 28 meq/L (ref 19–32)
Calcium: 9.8 mg/dL (ref 8.4–10.5)
Chloride: 105 mEq/L (ref 96–112)
Creatinine, Ser: 1.15 mg/dL (ref 0.40–1.50)
GFR: 69.8 mL/min (ref >60.00)
GLUCOSE: 94 mg/dL (ref 70–99)
POTASSIUM: 4.2 meq/L (ref 3.5–5.1)
SODIUM: 139 meq/L (ref 135–145)

## 2015-06-12 LAB — CBC WITH DIFFERENTIAL/PLATELET
Basophils Absolute: 0 10*3/uL (ref 0.0–0.1)
Basophils Relative: 0.6 % (ref 0.0–3.0)
EOS ABS: 0.1 10*3/uL (ref 0.0–0.7)
EOS PCT: 1.2 % (ref 0.0–5.0)
HCT: 35.9 % — ABNORMAL LOW (ref 39.0–52.0)
Hemoglobin: 11.9 g/dL — ABNORMAL LOW (ref 13.0–17.0)
LYMPHS ABS: 1.4 10*3/uL (ref 0.7–4.0)
LYMPHS PCT: 27.7 % (ref 12.0–46.0)
MCHC: 33.3 g/dL (ref 30.0–36.0)
MCV: 83 fl (ref 78.0–100.0)
MONO ABS: 0.3 10*3/uL (ref 0.1–1.0)
MONOS PCT: 5.4 % (ref 3.0–12.0)
NEUTROS ABS: 3.4 10*3/uL (ref 1.4–7.7)
NEUTROS PCT: 65.1 % (ref 43.0–77.0)
PLATELETS: 323 10*3/uL (ref 150.0–400.0)
RBC: 4.32 Mil/uL (ref 4.22–5.81)
RDW: 13.9 % (ref 11.5–15.5)
WBC: 5.2 10*3/uL (ref 4.0–10.5)

## 2015-06-12 LAB — URINALYSIS
BILIRUBIN URINE: NEGATIVE
HGB URINE DIPSTICK: NEGATIVE
Ketones, ur: NEGATIVE
LEUKOCYTES UA: NEGATIVE
NITRITE: NEGATIVE
PH: 6 (ref 5.0–8.0)
Specific Gravity, Urine: 1.02 (ref 1.000–1.030)
Total Protein, Urine: NEGATIVE
Urine Glucose: NEGATIVE
Urobilinogen, UA: 0.2 (ref 0.0–1.0)

## 2015-06-12 LAB — LIPID PANEL
CHOL/HDL RATIO: 5
CHOLESTEROL: 157 mg/dL (ref 0–200)
HDL: 30.3 mg/dL — ABNORMAL LOW (ref >39.00)
LDL CALC: 103 mg/dL — AB (ref 0–99)
NonHDL: 126.77
TRIGLYCERIDES: 120 mg/dL (ref 0.0–149.0)
VLDL: 24 mg/dL (ref 0.0–40.0)

## 2015-06-12 LAB — HEPATIC FUNCTION PANEL
ALT: 10 U/L (ref 0–53)
AST: 13 U/L (ref 0–37)
Albumin: 4.4 g/dL (ref 3.5–5.2)
Alkaline Phosphatase: 77 U/L (ref 39–117)
BILIRUBIN DIRECT: 0.1 mg/dL (ref 0.0–0.3)
BILIRUBIN TOTAL: 0.6 mg/dL (ref 0.2–1.2)
Total Protein: 7.2 g/dL (ref 6.0–8.3)

## 2015-06-12 LAB — TSH: TSH: 0.04 u[IU]/mL — AB (ref 0.35–4.50)

## 2015-06-12 LAB — PSA: PSA: 1.7 ng/mL (ref 0.10–4.00)

## 2015-06-13 LAB — HEPATITIS C ANTIBODY: HCV Ab: NEGATIVE

## 2015-06-14 ENCOUNTER — Other Ambulatory Visit: Payer: Self-pay | Admitting: Internal Medicine

## 2015-06-14 DIAGNOSIS — E034 Atrophy of thyroid (acquired): Secondary | ICD-10-CM

## 2015-06-14 MED ORDER — LEVOTHYROXINE SODIUM 150 MCG PO TABS: 150.0000 ug | ORAL_TABLET | Freq: Every day | ORAL | Status: DC

## 2015-09-12 ENCOUNTER — Other Ambulatory Visit (INDEPENDENT_AMBULATORY_CARE_PROVIDER_SITE_OTHER): Payer: BLUE CROSS/BLUE SHIELD

## 2015-09-12 DIAGNOSIS — E034 Atrophy of thyroid (acquired): Secondary | ICD-10-CM

## 2015-09-12 DIAGNOSIS — E038 Other specified hypothyroidism: Secondary | ICD-10-CM

## 2015-09-12 LAB — T4, FREE: FREE T4: 0.9 ng/dL (ref 0.60–1.60)

## 2015-09-12 LAB — TSH: TSH: 0.44 u[IU]/mL (ref 0.35–4.50)

## 2015-12-15 ENCOUNTER — Encounter: Payer: Self-pay | Admitting: Internal Medicine

## 2015-12-18 ENCOUNTER — Other Ambulatory Visit (INDEPENDENT_AMBULATORY_CARE_PROVIDER_SITE_OTHER): Payer: BLUE CROSS/BLUE SHIELD

## 2015-12-18 DIAGNOSIS — E034 Atrophy of thyroid (acquired): Secondary | ICD-10-CM

## 2015-12-18 LAB — T4, FREE: FREE T4: 1.05 ng/dL (ref 0.60–1.60)

## 2015-12-18 LAB — TSH: TSH: 0.7 u[IU]/mL (ref 0.35–4.50)

## 2015-12-18 LAB — T3, FREE: T3, Free: 3 pg/mL (ref 2.3–4.2)

## 2015-12-20 ENCOUNTER — Other Ambulatory Visit: Payer: Self-pay | Admitting: Internal Medicine

## 2015-12-20 DIAGNOSIS — Z0000 Encounter for general adult medical examination without abnormal findings: Secondary | ICD-10-CM

## 2015-12-21 ENCOUNTER — Encounter: Payer: Self-pay | Admitting: Internal Medicine

## 2015-12-26 ENCOUNTER — Other Ambulatory Visit (INDEPENDENT_AMBULATORY_CARE_PROVIDER_SITE_OTHER): Payer: BLUE CROSS/BLUE SHIELD

## 2015-12-26 ENCOUNTER — Ambulatory Visit (INDEPENDENT_AMBULATORY_CARE_PROVIDER_SITE_OTHER): Payer: BLUE CROSS/BLUE SHIELD | Admitting: Internal Medicine

## 2015-12-26 ENCOUNTER — Encounter: Payer: Self-pay | Admitting: Internal Medicine

## 2015-12-26 DIAGNOSIS — Z0000 Encounter for general adult medical examination without abnormal findings: Secondary | ICD-10-CM

## 2015-12-26 DIAGNOSIS — E034 Atrophy of thyroid (acquired): Secondary | ICD-10-CM | POA: Diagnosis not present

## 2015-12-26 DIAGNOSIS — Z23 Encounter for immunization: Secondary | ICD-10-CM | POA: Diagnosis not present

## 2015-12-26 LAB — CBC WITH DIFFERENTIAL/PLATELET
BASOS PCT: 0.5 % (ref 0.0–3.0)
Basophils Absolute: 0 10*3/uL (ref 0.0–0.1)
EOS ABS: 0.1 10*3/uL (ref 0.0–0.7)
EOS PCT: 2.1 % (ref 0.0–5.0)
HEMATOCRIT: 32.7 % — AB (ref 39.0–52.0)
HEMOGLOBIN: 10.7 g/dL — AB (ref 13.0–17.0)
LYMPHS PCT: 29.2 % (ref 12.0–46.0)
Lymphs Abs: 1.3 10*3/uL (ref 0.7–4.0)
MCHC: 32.6 g/dL (ref 30.0–36.0)
MCV: 77.5 fl — ABNORMAL LOW (ref 78.0–100.0)
Monocytes Absolute: 0.3 10*3/uL (ref 0.1–1.0)
Monocytes Relative: 6 % (ref 3.0–12.0)
Neutro Abs: 2.8 10*3/uL (ref 1.4–7.7)
Neutrophils Relative %: 62.2 % (ref 43.0–77.0)
Platelets: 323 10*3/uL (ref 150.0–400.0)
RBC: 4.22 Mil/uL (ref 4.22–5.81)
RDW: 14.9 % (ref 11.5–15.5)
WBC: 4.6 10*3/uL (ref 4.0–10.5)

## 2015-12-26 LAB — BASIC METABOLIC PANEL
BUN: 15 mg/dL (ref 6–23)
CHLORIDE: 105 meq/L (ref 96–112)
CO2: 29 meq/L (ref 19–32)
Calcium: 9.8 mg/dL (ref 8.4–10.5)
Creatinine, Ser: 1.15 mg/dL (ref 0.40–1.50)
GFR: 69.66 mL/min (ref >60.00)
GLUCOSE: 101 mg/dL — AB (ref 70–99)
POTASSIUM: 4.7 meq/L (ref 3.5–5.1)
Sodium: 141 mEq/L (ref 135–145)

## 2015-12-26 LAB — URINALYSIS
BILIRUBIN URINE: NEGATIVE
HGB URINE DIPSTICK: NEGATIVE
Ketones, ur: NEGATIVE
LEUKOCYTES UA: NEGATIVE
NITRITE: NEGATIVE
Specific Gravity, Urine: 1.02 (ref 1.000–1.030)
Total Protein, Urine: NEGATIVE
UROBILINOGEN UA: 0.2 (ref 0.0–1.0)
Urine Glucose: NEGATIVE
pH: 6.5 (ref 5.0–8.0)

## 2015-12-26 LAB — LIPID PANEL
CHOLESTEROL: 170 mg/dL (ref 0–200)
HDL: 34.6 mg/dL — ABNORMAL LOW (ref >39.00)
LDL Cholesterol: 117 mg/dL — ABNORMAL HIGH (ref 0–99)
NonHDL: 134.92
Total CHOL/HDL Ratio: 5
Triglycerides: 88 mg/dL (ref 0.0–149.0)
VLDL: 17.6 mg/dL (ref 0.0–40.0)

## 2015-12-26 LAB — HEPATIC FUNCTION PANEL
ALBUMIN: 4.6 g/dL (ref 3.5–5.2)
ALT: 11 U/L (ref 0–53)
AST: 15 U/L (ref 0–37)
Alkaline Phosphatase: 66 U/L (ref 39–117)
Bilirubin, Direct: 0.1 mg/dL (ref 0.0–0.3)
TOTAL PROTEIN: 7.4 g/dL (ref 6.0–8.3)
Total Bilirubin: 0.6 mg/dL (ref 0.2–1.2)

## 2015-12-26 NOTE — Assessment & Plan Note (Signed)
We discussed age appropriate health related issues, including available/recomended screening tests and vaccinations. We discussed a need for adhering to healthy diet and exercise. Labs/EKG were reviewed/ordered. All questions were answered. Colon due 2021 

## 2015-12-26 NOTE — Progress Notes (Signed)
Subjective:  Patient ID: Derrick Walters, male    DOB: 09/15/1960  Age: 55 y.o. MRN: [id number]  CC: Annual Exam   HPI Derrick Walters presents for a well exam  Outpatient Medications Prior to Visit  Medication Sig Dispense Refill  . acyclovir (ZOVIRAX) 200 MG capsule Take 200 mg by mouth daily.      Marland Kitchen aspirin 81 MG EC tablet Take 81 mg by mouth daily.      . Cholecalciferol 1000 UNITS tablet Take 1,000 Units by mouth daily.      Marland Kitchen levothyroxine (SYNTHROID, LEVOTHROID) 150 MCG tablet Take 1 tablet (150 mcg total) by mouth daily before breakfast. 90 tablet 3  . fish oil-omega-3 fatty acids 1000 MG capsule Take 2 g by mouth daily.     No facility-administered medications prior to visit.     ROS Review of Systems  Constitutional: Positive for fatigue. Negative for appetite change and unexpected weight change.  HENT: Negative for congestion, nosebleeds, sneezing, sore throat and trouble swallowing.   Eyes: Negative for itching and visual disturbance.  Respiratory: Negative for cough.   Cardiovascular: Negative for chest pain, palpitations and leg swelling.  Gastrointestinal: Negative for abdominal distention, blood in stool, diarrhea and nausea.  Genitourinary: Negative for frequency and hematuria.  Musculoskeletal: Negative for back pain, joint swelling and neck pain.  Skin: Negative for rash.  Neurological: Negative for dizziness, tremors, speech difficulty and weakness.  Psychiatric/Behavioral: Negative for agitation, dysphoric mood, sleep disturbance and suicidal ideas. The patient is not nervous/anxious.     Objective:  BP 130/72   Pulse 67   Ht 6\' 1"  (1.854 m)   Wt 193 lb (87.5 kg)   SpO2 98%   BMI 25.46 kg/m   BP Readings from Last 3 Encounters:  12/26/15 130/72  12/19/14 130/85  08/30/14 130/82    Wt Readings from Last 3 Encounters:  12/26/15 193 lb (87.5 kg)  12/19/14 184 lb (83.5 kg)  08/30/14 186 lb (84.4 kg)    Physical Exam  Constitutional: He is  oriented to person, place, and time. He appears well-developed and well-nourished. No distress.  HENT:  Head: Normocephalic and atraumatic.  Right Ear: External ear normal.  Left Ear: External ear normal.  Nose: Nose normal.  Mouth/Throat: Oropharynx is clear and moist. No oropharyngeal exudate.  Eyes: Conjunctivae and EOM are normal. Pupils are equal, round, and reactive to light. Right eye exhibits no discharge. Left eye exhibits no discharge. No scleral icterus.  Neck: Normal range of motion. Neck supple. No JVD present. No tracheal deviation present. No thyromegaly present.  Cardiovascular: Normal rate, regular rhythm, normal heart sounds and intact distal pulses.  Exam reveals no gallop and no friction rub.   No murmur heard. Pulmonary/Chest: Effort normal and breath sounds normal. No stridor. No respiratory distress. He has no wheezes. He has no rales. He exhibits no tenderness.  Abdominal: Soft. Bowel sounds are normal. He exhibits no distension and no mass. There is no tenderness. There is no rebound and no guarding.  Genitourinary: Rectum normal, prostate normal and penis normal. Rectal exam shows guaiac negative stool. No penile tenderness.  Musculoskeletal: Normal range of motion. He exhibits no edema or tenderness.  Lymphadenopathy:    He has no cervical adenopathy.  Neurological: He is alert and oriented to person, place, and time. He has normal reflexes. No cranial nerve deficit. He exhibits normal muscle tone. Coordination normal.  Skin: Skin is warm and dry. No rash noted. He is not  diaphoretic. No erythema. No pallor.  Psychiatric: He has a normal mood and affect. His behavior is normal. Judgment and thought content normal.    Lab Results  Component Value Date   WBC 5.2 06/12/2015   HGB 11.9 (L) 06/12/2015   HCT 35.9 (L) 06/12/2015   PLT 323.0 06/12/2015   GLUCOSE 94 06/12/2015   CHOL 157 06/12/2015   TRIG 120.0 06/12/2015   HDL 30.30 (L) 06/12/2015   LDLCALC 103 (H)  06/12/2015   ALT 10 06/12/2015   AST 13 06/12/2015   NA 139 06/12/2015   K 4.2 06/12/2015   CL 105 06/12/2015   CREATININE 1.15 06/12/2015   BUN 18 06/12/2015   CO2 28 06/12/2015   TSH 0.70 12/18/2015   PSA 1.70 06/12/2015    No results found.  Assessment & Plan:   There are no diagnoses linked to this encounter. I am having Mr. Jariwala maintain his acyclovir, aspirin, Cholecalciferol, fish oil-omega-3 fatty acids, and levothyroxine.  No orders of the defined types were placed in this encounter.    Follow-up: No Follow-up on file.  Walker Kehr, MD

## 2015-12-26 NOTE — Progress Notes (Signed)
Pre visit review using our clinic review tool, if applicable. No additional management support is needed unless otherwise documented below in the visit note. 

## 2015-12-26 NOTE — Assessment & Plan Note (Signed)
On Levothroid 

## 2015-12-26 NOTE — Addendum Note (Signed)
Addended by: Cresenciano Lick on: 12/26/2015 02:18 PM   Modules accepted: Orders

## 2015-12-27 LAB — HEPATITIS C ANTIBODY: HCV Ab: NEGATIVE

## 2015-12-29 ENCOUNTER — Other Ambulatory Visit: Payer: Self-pay | Admitting: Internal Medicine

## 2015-12-29 DIAGNOSIS — D509 Iron deficiency anemia, unspecified: Secondary | ICD-10-CM

## 2015-12-29 LAB — PSA: PSA: 1.73 ng/mL (ref 0.10–4.00)

## 2015-12-29 LAB — T4, FREE: FREE T4: 1.28 ng/dL (ref 0.60–1.60)

## 2015-12-29 LAB — TSH: TSH: 0.45 u[IU]/mL (ref 0.35–4.50)

## 2016-01-05 ENCOUNTER — Encounter: Payer: Self-pay | Admitting: Physician Assistant

## 2016-01-09 ENCOUNTER — Encounter: Payer: Self-pay | Admitting: Internal Medicine

## 2016-01-13 ENCOUNTER — Ambulatory Visit: Payer: BLUE CROSS/BLUE SHIELD | Admitting: Physician Assistant

## 2016-01-14 ENCOUNTER — Ambulatory Visit (INDEPENDENT_AMBULATORY_CARE_PROVIDER_SITE_OTHER): Payer: BLUE CROSS/BLUE SHIELD | Admitting: Nurse Practitioner

## 2016-01-14 ENCOUNTER — Encounter: Payer: Self-pay | Admitting: Nurse Practitioner

## 2016-01-14 VITALS — BP 106/78 | HR 84 | Ht 72.25 in | Wt 194.2 lb

## 2016-01-14 DIAGNOSIS — D509 Iron deficiency anemia, unspecified: Secondary | ICD-10-CM | POA: Diagnosis not present

## 2016-01-14 NOTE — Patient Instructions (Signed)
Call us early December to discuss lab results from PCP.   DO NOT donate any blood.   No NSAIDs such as Ibuprofen or goody powders for the time being. Except the baby aspirin recommended by your PCP.   Call us if you have any overt bleeding.

## 2016-01-14 NOTE — Progress Notes (Signed)
      HPI:  55 yo male known to Dr. Carlean Purl from a screening colonoscopy in 2011. Patient referred by PCP Dr Alain Marion for evaluation of anemia. No overt GI bleeding. No abdominal pain, nausea or weight loss. Patient takes a baby asa about every other day. He averages less than 4 doses of NSAIDs a month.    Past Medical History:  Diagnosis Date  . Anemia   . 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 History of nephrolithiasis   . Hyperlipidemia   . Hypothyroidism     Past Surgical History:  Procedure Laterality Date  . TONSILLECTOMY     Family History  Problem Relation Age of Onset  . Depression Father     committed suicide at age 30  . Suicidality Father   . COPD Mother   . Lung cancer Mother   . Depression Brother    Social History  Substance Use Topics  . Smoking status: Never Smoker  . Smokeless tobacco: Never Used  . Alcohol use Yes     Comment: less than 1 per day   Current Outpatient Prescriptions  Medication Sig Dispense Refill  . acyclovir (ZOVIRAX) 200 MG capsule Take 200 mg by mouth daily.      Marland Kitchen aspirin 81 MG EC tablet Take 81 mg by mouth daily.      . Cholecalciferol 1000 UNITS tablet Take 1,000 Units by mouth daily.      Marland Kitchen levothyroxine (SYNTHROID, LEVOTHROID) 150 MCG tablet Take 1 tablet (150 mcg total) by mouth daily before breakfast. 90 tablet 3   No current facility-administered medications for this visit.    No Known Allergies   Review of Systems: All systems reviewed and negative except where noted in HPI.   Physical Exam: BP 106/78 (BP Location: Left Arm, Patient Position: Sitting, Cuff Size: Normal)   Pulse 84   Ht 6' 0.25" (1.835 m) Comment: height measured without shoes  Wt 194 lb 4 oz (88.1 kg)   BMI 26.16 kg/m  Constitutional:  Well-developed, white male in no acute distress. Psychiatric: Normal mood and affect. Behavior is normal. HEENT: Normocephalic and atraumatic. Conjunctivae are normal. No scleral icterus. Neck supple.  Cardiovascular:  Normal rate, regular rhythm.  Pulmonary/chest: Effort normal and breath sounds normal. No wheezing, rales or rhonchi. Abdominal: Soft, nondistended, nontender. Bowel sounds active throughout. There are no masses palpable. No hepatomegaly. Rectal: trace of light brown secretions, heme negative Extremities: no edema Lymphadenopathy: No cervical adenopathy noted. Neurological: Alert and oriented to person place and time. Skin: Skin is warm and dry. No rashes noted.   ASSESSMENT AND PLAN:  55 yo male referred for anemia. Hgb ~ 13 in Oct 2016. Following that patient donated a pint of blood over the winter. Repeat hgb mid April was down a gram to 11.9. Three weeks ago hgb down further to 10.7  (MCV 77) but patient donated two pints of blood in August. Suspect hgb drifting from blood donations. No overt GI bleeding, heme negative today.  -Plan is to stop donating blood for now. He is getting repeat labs early December. If hgb not improved or if he develops overt bleeding / bowel changes or other alarm symptoms in the interim then patient will call to arrange endoscopic evaluation. Otherwise screening colonoscopy due again in 2021.  Tye Savoy, NP  01/15/16  Cc:  Cassandria Anger, MD

## 2016-01-21 NOTE — Progress Notes (Signed)
Agree with Ms. Guenther's assessment and plan.  Seems like blood donation is the problem.  Consider hemoccult evaluation before repeating endoscopic evaluation depending upon repeat anemia labs.   Gatha Mayer, MD, [facility]

## 2016-01-30 ENCOUNTER — Other Ambulatory Visit (INDEPENDENT_AMBULATORY_CARE_PROVIDER_SITE_OTHER): Payer: BLUE CROSS/BLUE SHIELD

## 2016-01-30 DIAGNOSIS — E034 Atrophy of thyroid (acquired): Secondary | ICD-10-CM

## 2016-01-30 LAB — T4, FREE: FREE T4: 1.11 ng/dL (ref 0.60–1.60)

## 2016-01-30 LAB — TSH: TSH: 0.29 u[IU]/mL — AB (ref 0.35–4.50)

## 2016-02-02 ENCOUNTER — Encounter: Payer: Self-pay | Admitting: Internal Medicine

## 2016-02-06 ENCOUNTER — Encounter: Payer: Self-pay | Admitting: Internal Medicine

## 2016-05-19 ENCOUNTER — Encounter: Payer: Self-pay | Admitting: Internal Medicine

## 2016-05-21 ENCOUNTER — Other Ambulatory Visit: Payer: Self-pay | Admitting: Internal Medicine

## 2016-05-21 DIAGNOSIS — D508 Other iron deficiency anemias: Secondary | ICD-10-CM

## 2016-05-27 ENCOUNTER — Other Ambulatory Visit (INDEPENDENT_AMBULATORY_CARE_PROVIDER_SITE_OTHER): Payer: BLUE CROSS/BLUE SHIELD

## 2016-05-27 DIAGNOSIS — D508 Other iron deficiency anemias: Secondary | ICD-10-CM | POA: Diagnosis not present

## 2016-05-27 LAB — CBC WITH DIFFERENTIAL/PLATELET
BASOS ABS: 0.1 10*3/uL (ref 0.0–0.1)
Basophils Relative: 1.2 % (ref 0.0–3.0)
EOS ABS: 0.2 10*3/uL (ref 0.0–0.7)
Eosinophils Relative: 3 % (ref 0.0–5.0)
HCT: 39.5 % (ref 39.0–52.0)
HEMOGLOBIN: 13 g/dL (ref 13.0–17.0)
LYMPHS ABS: 1.6 10*3/uL (ref 0.7–4.0)
Lymphocytes Relative: 31.4 % (ref 12.0–46.0)
MCHC: 33.1 g/dL (ref 30.0–36.0)
MCV: 82.6 fl (ref 78.0–100.0)
MONO ABS: 0.3 10*3/uL (ref 0.1–1.0)
Monocytes Relative: 6.4 % (ref 3.0–12.0)
NEUTROS PCT: 58 % (ref 43.0–77.0)
Neutro Abs: 3 10*3/uL (ref 1.4–7.7)
Platelets: 290 10*3/uL (ref 150.0–400.0)
RBC: 4.78 Mil/uL (ref 4.22–5.81)
RDW: 23.1 % — ABNORMAL HIGH (ref 11.5–15.5)
WBC: 5.2 10*3/uL (ref 4.0–10.5)

## 2016-05-27 LAB — TSH: TSH: 1.48 u[IU]/mL (ref 0.35–4.50)

## 2016-05-30 ENCOUNTER — Other Ambulatory Visit: Payer: Self-pay | Admitting: Internal Medicine

## 2016-06-14 ENCOUNTER — Encounter: Payer: Self-pay | Admitting: Nurse Practitioner

## 2016-12-03 ENCOUNTER — Ambulatory Visit: Payer: Self-pay | Admitting: Internal Medicine

## 2016-12-12 ENCOUNTER — Encounter: Payer: Self-pay | Admitting: Internal Medicine

## 2016-12-12 DIAGNOSIS — Z0000 Encounter for general adult medical examination without abnormal findings: Secondary | ICD-10-CM

## 2016-12-17 ENCOUNTER — Other Ambulatory Visit (INDEPENDENT_AMBULATORY_CARE_PROVIDER_SITE_OTHER): Payer: BLUE CROSS/BLUE SHIELD

## 2016-12-17 DIAGNOSIS — Z0000 Encounter for general adult medical examination without abnormal findings: Secondary | ICD-10-CM

## 2016-12-17 LAB — CBC WITH DIFFERENTIAL/PLATELET
BASOS PCT: 1.1 % (ref 0.0–3.0)
Basophils Absolute: 0 10*3/uL (ref 0.0–0.1)
EOS ABS: 0.1 10*3/uL (ref 0.0–0.7)
EOS PCT: 2.2 % (ref 0.0–5.0)
HEMATOCRIT: 42 % (ref 39.0–52.0)
HEMOGLOBIN: 14.1 g/dL (ref 13.0–17.0)
LYMPHS PCT: 31 % (ref 12.0–46.0)
Lymphs Abs: 1.4 10*3/uL (ref 0.7–4.0)
MCHC: 33.5 g/dL (ref 30.0–36.0)
MCV: 92.1 fl (ref 78.0–100.0)
Monocytes Absolute: 0.4 10*3/uL (ref 0.1–1.0)
Monocytes Relative: 8.3 % (ref 3.0–12.0)
Neutro Abs: 2.6 10*3/uL (ref 1.4–7.7)
Neutrophils Relative %: 57.4 % (ref 43.0–77.0)
Platelets: 222 10*3/uL (ref 150.0–400.0)
RBC: 4.56 Mil/uL (ref 4.22–5.81)
RDW: 12.8 % (ref 11.5–15.5)
WBC: 4.5 10*3/uL (ref 4.0–10.5)

## 2016-12-17 LAB — LIPID PANEL
CHOL/HDL RATIO: 5
Cholesterol: 163 mg/dL (ref 0–200)
HDL: 34.9 mg/dL — AB (ref >39.00)
LDL CALC: 111 mg/dL — AB (ref 0–99)
NonHDL: 127.82
TRIGLYCERIDES: 82 mg/dL (ref 0.0–149.0)
VLDL: 16.4 mg/dL (ref 0.0–40.0)

## 2016-12-17 LAB — COMPREHENSIVE METABOLIC PANEL
ALBUMIN: 4.5 g/dL (ref 3.5–5.2)
ALK PHOS: 72 U/L (ref 39–117)
ALT: 12 U/L (ref 0–53)
AST: 15 U/L (ref 0–37)
BILIRUBIN TOTAL: 0.6 mg/dL (ref 0.2–1.2)
BUN: 14 mg/dL (ref 6–23)
CALCIUM: 9.8 mg/dL (ref 8.4–10.5)
CHLORIDE: 100 meq/L (ref 96–112)
CO2: 31 mEq/L (ref 19–32)
CREATININE: 1.08 mg/dL (ref 0.40–1.50)
GFR: 74.64 mL/min (ref >60.00)
Glucose, Bld: 96 mg/dL (ref 70–99)
Potassium: 4.7 mEq/L (ref 3.5–5.1)
Sodium: 139 mEq/L (ref 135–145)
TOTAL PROTEIN: 7.4 g/dL (ref 6.0–8.3)

## 2016-12-17 LAB — HEPATIC FUNCTION PANEL
ALT: 12 U/L (ref 0–53)
AST: 15 U/L (ref 0–37)
Albumin: 4.5 g/dL (ref 3.5–5.2)
Alkaline Phosphatase: 72 U/L (ref 39–117)
BILIRUBIN DIRECT: 0.1 mg/dL (ref 0.0–0.3)
BILIRUBIN TOTAL: 0.6 mg/dL (ref 0.2–1.2)
Total Protein: 7.4 g/dL (ref 6.0–8.3)

## 2016-12-17 LAB — URINALYSIS
Bilirubin Urine: NEGATIVE
Hgb urine dipstick: NEGATIVE
Ketones, ur: NEGATIVE
Leukocytes, UA: NEGATIVE
Nitrite: NEGATIVE
SPECIFIC GRAVITY, URINE: 1.015 (ref 1.000–1.030)
TOTAL PROTEIN, URINE-UPE24: NEGATIVE
UROBILINOGEN UA: 0.2 (ref 0.0–1.0)
Urine Glucose: NEGATIVE
pH: 7 (ref 5.0–8.0)

## 2016-12-17 LAB — TSH: TSH: 0.5 u[IU]/mL (ref 0.35–4.50)

## 2016-12-17 LAB — PSA: PSA: 2.06 ng/mL (ref 0.10–4.00)

## 2016-12-31 ENCOUNTER — Ambulatory Visit (INDEPENDENT_AMBULATORY_CARE_PROVIDER_SITE_OTHER): Payer: BLUE CROSS/BLUE SHIELD | Admitting: Internal Medicine

## 2016-12-31 ENCOUNTER — Encounter: Payer: Self-pay | Admitting: Internal Medicine

## 2016-12-31 VITALS — BP 122/80 | HR 68 | Temp 98.1°F | Ht 72.25 in | Wt 196.0 lb

## 2016-12-31 DIAGNOSIS — E785 Hyperlipidemia, unspecified: Secondary | ICD-10-CM

## 2016-12-31 DIAGNOSIS — R972 Elevated prostate specific antigen [PSA]: Secondary | ICD-10-CM | POA: Insufficient documentation

## 2016-12-31 DIAGNOSIS — Z23 Encounter for immunization: Secondary | ICD-10-CM | POA: Diagnosis not present

## 2016-12-31 DIAGNOSIS — E034 Atrophy of thyroid (acquired): Secondary | ICD-10-CM

## 2016-12-31 DIAGNOSIS — Z0000 Encounter for general adult medical examination without abnormal findings: Secondary | ICD-10-CM

## 2016-12-31 NOTE — Assessment & Plan Note (Signed)
Fish oil

## 2016-12-31 NOTE — Progress Notes (Signed)
Subjective:  Patient ID: Derrick Walters, male    DOB: 1960-09-21  Age: 56 y.o. MRN: [id number]  CC: No chief complaint on file.   HPI LEMAN MARTINEK presents for a well exam  Outpatient Medications Prior to Visit  Medication Sig Dispense Refill  . acyclovir (ZOVIRAX) 200 MG capsule Take 200 mg by mouth daily.      Marland Kitchen aspirin 81 MG EC tablet Take 81 mg by mouth daily.      . Cholecalciferol 1000 UNITS tablet Take 1,000 Units by mouth daily.      Marland Kitchen SYNTHROID 150 MCG tablet TAKE 1 TABLET DAILY BEFORE BREAKFAST 90 tablet 2   No facility-administered medications prior to visit.     ROS Review of Systems  Constitutional: Negative for appetite change, fatigue and unexpected weight change.  HENT: Negative for congestion, nosebleeds, sneezing, sore throat and trouble swallowing.   Eyes: Negative for itching and visual disturbance.  Respiratory: Negative for cough.   Cardiovascular: Negative for chest pain, palpitations and leg swelling.  Gastrointestinal: Negative for abdominal distention, blood in stool, diarrhea and nausea.  Genitourinary: Negative for frequency and hematuria.  Musculoskeletal: Negative for back pain, gait problem, joint swelling and neck pain.  Skin: Negative for rash.  Neurological: Negative for dizziness, tremors, speech difficulty and weakness.  Psychiatric/Behavioral: Negative for agitation, dysphoric mood and sleep disturbance. The patient is not nervous/anxious.     Objective:  BP 122/80 (BP Location: Left Arm, Patient Position: Sitting, Cuff Size: Normal)   Pulse 68   Temp 98.1 F (36.7 C) (Oral)   Ht 6' 0.25" (1.835 m)   Wt 196 lb (88.9 kg)   SpO2 98%   BMI 26.40 kg/m   BP Readings from Last 3 Encounters:  12/31/16 122/80  01/14/16 106/78  12/26/15 130/72    Wt Readings from Last 3 Encounters:  12/31/16 196 lb (88.9 kg)  01/14/16 194 lb 4 oz (88.1 kg)  12/26/15 193 lb (87.5 kg)    Physical Exam  Constitutional: He is oriented to person,  place, and time. He appears well-developed. No distress.  NAD  HENT:  Mouth/Throat: Oropharynx is clear and moist.  Eyes: Pupils are equal, round, and reactive to light. Conjunctivae are normal.  Neck: Normal range of motion. No JVD present. No thyromegaly present.  Cardiovascular: Normal rate, regular rhythm, normal heart sounds and intact distal pulses.  Exam reveals no gallop and no friction rub.   No murmur heard. Pulmonary/Chest: Effort normal and breath sounds normal. No respiratory distress. He has no wheezes. He has no rales. He exhibits no tenderness.  Abdominal: Soft. Bowel sounds are normal. He exhibits no distension and no mass. There is no tenderness. There is no rebound and no guarding.  Genitourinary: Rectum normal and prostate normal. Rectal exam shows guaiac negative stool.  Musculoskeletal: Normal range of motion. He exhibits no edema or tenderness.  Lymphadenopathy:    He has no cervical adenopathy.  Neurological: He is alert and oriented to person, place, and time. He has normal reflexes. No cranial nerve deficit. He exhibits normal muscle tone. He displays a negative Romberg sign. Coordination and gait normal.  Skin: Skin is warm and dry. No rash noted.  Psychiatric: He has a normal mood and affect. His behavior is normal. Judgment and thought content normal.    Lab Results  Component Value Date   WBC 4.5 12/17/2016   HGB 14.1 12/17/2016   HCT 42.0 12/17/2016   PLT 222.0 12/17/2016   GLUCOSE  96 12/17/2016   CHOL 163 12/17/2016   TRIG 82.0 12/17/2016   HDL 34.90 (L) 12/17/2016   LDLCALC 111 (H) 12/17/2016   ALT 12 12/17/2016   ALT 12 12/17/2016   AST 15 12/17/2016   AST 15 12/17/2016   NA 139 12/17/2016   K 4.7 12/17/2016   CL 100 12/17/2016   CREATININE 1.08 12/17/2016   BUN 14 12/17/2016   CO2 31 12/17/2016   TSH 0.50 12/17/2016   PSA 2.06 12/17/2016    No results found.  Assessment & Plan:   There are no diagnoses linked to this encounter. I am  having Mr. Oblinger maintain his acyclovir, aspirin, Cholecalciferol, and SYNTHROID.  No orders of the defined types were placed in this encounter.    Follow-up: No Follow-up on file.  Walker Kehr, MD

## 2016-12-31 NOTE — Assessment & Plan Note (Signed)
We discussed age appropriate health related issues, including available/recomended screening tests and vaccinations. We discussed a need for adhering to healthy diet and exercise. Labs were ordered to be later reviewed . All questions were answered.  Flu shot Shingrix discussed

## 2016-12-31 NOTE — Assessment & Plan Note (Signed)
On Levothroid 

## 2016-12-31 NOTE — Assessment & Plan Note (Signed)
PSA in 6mo

## 2017-01-05 DIAGNOSIS — Z23 Encounter for immunization: Secondary | ICD-10-CM

## 2017-01-05 NOTE — Addendum Note (Signed)
Addended by: Karren Cobble on: 01/05/2017 11:53 AM   Modules accepted: Orders

## 2017-01-06 NOTE — Progress Notes (Signed)
Subjective:    Patient ID: Derrick Walters, male    DOB: 11-Nov-1960, 56 y.o.   MRN: [id number]  HPI He is here for an acute visit for cold symptoms.  His symptoms started 4 days ago.    He is experiencing hoarseness, scratchy throat.  The throat is not sore.  He has had an occasional cough that is mild.  He denies any other symptoms.    He has tried taking honey, salt water, rock and rye and advil.     Medications and allergies reviewed with patient and updated if appropriate.  Patient Active Problem List   Diagnosis Date Noted  . Elevated PSA 12/31/2016  . Tick bite, infected 08/14/2013  . Anemia 11/16/2011  . Well adult exam 10/07/2010  . Dyslipidemia 10/07/2010  . Neoplasm of uncertain behavior of skin 10/07/2010  . Hypothyroidism 08/29/2007  . NEPHROLITHIASIS, HX OF 08/29/2007    Current Outpatient Medications on File Prior to Visit  Medication Sig Dispense Refill  . acyclovir (ZOVIRAX) 200 MG capsule Take 200 mg by mouth daily.      Marland Kitchen aspirin 81 MG EC tablet Take 81 mg by mouth daily.      . Cholecalciferol 1000 UNITS tablet Take 1,000 Units by mouth daily.      Marland Kitchen SYNTHROID 150 MCG tablet TAKE 1 TABLET DAILY BEFORE BREAKFAST 90 tablet 2   No current facility-administered medications on file prior to visit.     Past Medical History:  Diagnosis Date  . Anemia   . 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 History of nephrolithiasis   . Hyperlipidemia   . Hypothyroidism     Past Surgical History:  Procedure Laterality Date  . TONSILLECTOMY      Social History   Socioeconomic History  . Marital status: Divorced    Spouse name: None  . Number of children: 0  . Years of education: None  . Highest education level: None  Social Needs  . Financial resource strain: None  . Food insecurity - worry: None  . Food insecurity - inability: None  . Transportation needs - medical: None  . Transportation needs - non-medical: None  Occupational History  . Occupation: help  desk  Tobacco Use  . Smoking status: Never Smoker  . Smokeless tobacco: Never Used  Substance and Sexual Activity  . Alcohol use: Yes    Comment: less than 1 per day  . Drug use: No  . Sexual activity: Yes  Other Topics Concern  . None  Social History Narrative   Regular exercise-yes, tennis and running    Family History  Problem Relation Age of Onset  . Depression Father        committed suicide at age 30  . Suicidality Father   . COPD Mother   . Lung cancer Mother   . Depression Brother     Review of Systems  Constitutional: Negative for chills and fever.  HENT: Positive for voice change (improved). Negative for congestion, ear pain, postnasal drip, sinus pain and sore throat.   Respiratory: Positive for cough (occ coughing up sputum). Negative for shortness of breath and wheezing.   Gastrointestinal: Negative for abdominal pain, diarrhea and nausea.  Musculoskeletal: Negative for myalgias.  Neurological: Negative for light-headedness and headaches.       Objective:   Vitals:   01/07/17 1546  BP: 134/78  Pulse: 65  Temp: 98.5 F (36.9 C)  SpO2: 99%   Filed Weights  01/07/17 1546  Weight: 199 lb (90.3 kg)   Body mass index is 26.8 kg/m.  Wt Readings from Last 3 Encounters:  01/07/17 199 lb (90.3 kg)  12/31/16 196 lb (88.9 kg)  01/14/16 194 lb 4 oz (88.1 kg)     Physical Exam GENERAL APPEARANCE: Appears stated age, well appearing, NAD EYES: conjunctiva clear, no icterus HEENT: bilateral tympanic membranes and ear canals normal, oropharynx with no erythema, no thyromegaly, trachea midline, no cervical or supraclavicular lymphadenopathy LUNGS: Clear to auscultation without wheeze or crackles, unlabored breathing, good air entry bilaterally HEART: Normal S1,S2 without murmurs EXTREMITIES: Without clubbing, cyanosis, or edema        Assessment & Plan:   See Problem List for Assessment and Plan of chronic medical problems.

## 2017-01-07 ENCOUNTER — Ambulatory Visit (INDEPENDENT_AMBULATORY_CARE_PROVIDER_SITE_OTHER): Payer: BLUE CROSS/BLUE SHIELD | Admitting: Internal Medicine

## 2017-01-07 ENCOUNTER — Encounter: Payer: Self-pay | Admitting: Internal Medicine

## 2017-01-07 VITALS — BP 134/78 | HR 65 | Temp 98.5°F | Ht 72.25 in | Wt 199.0 lb

## 2017-01-07 DIAGNOSIS — J040 Acute laryngitis: Secondary | ICD-10-CM

## 2017-01-07 NOTE — Patient Instructions (Signed)
Rest, increase fluids and rest your voice.       Laryngitis Laryngitis is inflammation of your vocal cords. This causes hoarseness, coughing, loss of voice, sore throat, or a dry throat. Your vocal cords are two bands of muscles that are found in your throat. When you speak, these cords come together and vibrate. These vibrations come out through your mouth as sound. When your vocal cords are inflamed, your voice sounds different. Laryngitis can be temporary (acute) or long-term (chronic). Most cases of acute laryngitis improve with time. Chronic laryngitis is laryngitis that lasts for more than three weeks. What are the causes? Acute laryngitis may be caused by:  A viral infection.  Lots of talking, yelling, or singing. This is also called vocal strain.  Bacterial infections.  Chronic laryngitis may be caused by:  Vocal strain.  Injury to your vocal cords.  Acid reflux (gastroesophageal reflux disease or GERD).  Allergies.  Sinus infection.  Smoking.  Alcohol abuse.  Breathing in chemicals or dust.  Growths on the vocal cords.  What increases the risk? Risk factors for laryngitis include:  Smoking.  Alcohol abuse.  Having allergies.  What are the signs or symptoms? Symptoms of laryngitis may include:  Low, hoarse voice.  Loss of voice.  Dry cough.  Sore throat.  Stuffy nose.  How is this diagnosed? Laryngitis may be diagnosed by:  Physical exam.  Throat culture.  Blood test.  Laryngoscopy. This procedure allows your health care provider to look at your vocal cords with a mirror or viewing tube.  How is this treated? Treatment for laryngitis depends on what is causing it. Usually, treatment involves resting your voice and using medicines to soothe your throat. However, if your laryngitis is caused by a bacterial infection, you may need to take antibiotic medicine. If your laryngitis is caused by a growth, you may need to have a procedure to  remove it. Follow these instructions at home:  Drink enough fluid to keep your urine clear or pale yellow.  Breathe in moist air. Use a humidifier if you live in a dry climate.  Take medicines only as directed by your health care provider.  If you were prescribed an antibiotic medicine, finish it all even if you start to feel better.  Do not smoke cigarettes or electronic cigarettes. If you need help quitting, ask your health care provider.  Talk as little as possible. Also avoid whispering, which can cause vocal strain.  Write instead of talking. Do this until your voice is back to normal. Contact a health care provider if:  You have a fever.  You have increasing pain.  You have difficulty swallowing. Get help right away if:  You cough up blood.  You have trouble breathing. This information is not intended to replace advice given to you by your health care provider. Make sure you discuss any questions you have with your health care provider. Document Released: 02/15/2005 Document Revised: 07/24/2015 Document Reviewed: 07/31/2013 Elsevier Interactive Patient Education  [employer].

## 2017-01-08 DIAGNOSIS — J040 Acute laryngitis: Secondary | ICD-10-CM | POA: Insufficient documentation

## 2017-01-08 NOTE — Assessment & Plan Note (Signed)
Viral in nature No treatment needed Continue symptomatic treatment

## 2017-02-17 ENCOUNTER — Other Ambulatory Visit: Payer: Self-pay | Admitting: Internal Medicine

## 2017-06-07 ENCOUNTER — Encounter: Payer: Self-pay | Admitting: Internal Medicine

## 2017-06-15 ENCOUNTER — Other Ambulatory Visit (INDEPENDENT_AMBULATORY_CARE_PROVIDER_SITE_OTHER): Payer: BLUE CROSS/BLUE SHIELD

## 2017-06-15 DIAGNOSIS — D508 Other iron deficiency anemias: Secondary | ICD-10-CM

## 2017-06-15 DIAGNOSIS — E785 Hyperlipidemia, unspecified: Secondary | ICD-10-CM

## 2017-06-15 DIAGNOSIS — E034 Atrophy of thyroid (acquired): Secondary | ICD-10-CM | POA: Diagnosis not present

## 2017-06-15 LAB — CBC WITH DIFFERENTIAL/PLATELET
BASOS PCT: 0.9 % (ref 0.0–3.0)
Basophils Absolute: 0 10*3/uL (ref 0.0–0.1)
EOS PCT: 2.4 % (ref 0.0–5.0)
Eosinophils Absolute: 0.1 10*3/uL (ref 0.0–0.7)
HEMATOCRIT: 39 % (ref 39.0–52.0)
HEMOGLOBIN: 13 g/dL (ref 13.0–17.0)
LYMPHS PCT: 34 % (ref 12.0–46.0)
Lymphs Abs: 1.4 10*3/uL (ref 0.7–4.0)
MCHC: 33.4 g/dL (ref 30.0–36.0)
MCV: 86.8 fl (ref 78.0–100.0)
Monocytes Absolute: 0.3 10*3/uL (ref 0.1–1.0)
Monocytes Relative: 6.6 % (ref 3.0–12.0)
Neutro Abs: 2.4 10*3/uL (ref 1.4–7.7)
Neutrophils Relative %: 56.1 % (ref 43.0–77.0)
Platelets: 272 10*3/uL (ref 150.0–400.0)
RBC: 4.5 Mil/uL (ref 4.22–5.81)
RDW: 13.5 % (ref 11.5–15.5)
WBC: 4.2 10*3/uL (ref 4.0–10.5)

## 2017-06-15 LAB — IBC PANEL
Iron: 57 ug/dL (ref 42–165)
Saturation Ratios: 12.3 % — ABNORMAL LOW (ref 20.0–50.0)
Transferrin: 330 mg/dL (ref 212.0–360.0)

## 2017-06-15 LAB — TSH: TSH: 0.13 u[IU]/mL — ABNORMAL LOW (ref 0.35–4.50)

## 2017-06-15 LAB — PSA: PSA: 1.84 ng/mL (ref 0.10–4.00)

## 2017-06-15 LAB — T4, FREE: FREE T4: 1.11 ng/dL (ref 0.60–1.60)

## 2017-12-26 ENCOUNTER — Other Ambulatory Visit: Payer: Self-pay | Admitting: Internal Medicine

## 2017-12-26 DIAGNOSIS — E034 Atrophy of thyroid (acquired): Secondary | ICD-10-CM

## 2017-12-26 DIAGNOSIS — Z0000 Encounter for general adult medical examination without abnormal findings: Secondary | ICD-10-CM

## 2017-12-30 ENCOUNTER — Other Ambulatory Visit (INDEPENDENT_AMBULATORY_CARE_PROVIDER_SITE_OTHER): Payer: BLUE CROSS/BLUE SHIELD

## 2017-12-30 DIAGNOSIS — E034 Atrophy of thyroid (acquired): Secondary | ICD-10-CM

## 2017-12-30 DIAGNOSIS — Z0000 Encounter for general adult medical examination without abnormal findings: Secondary | ICD-10-CM

## 2017-12-30 LAB — CBC WITH DIFFERENTIAL/PLATELET
BASOS PCT: 1 % (ref 0.0–3.0)
Basophils Absolute: 0 10*3/uL (ref 0.0–0.1)
Eosinophils Absolute: 0.1 10*3/uL (ref 0.0–0.7)
Eosinophils Relative: 1.8 % (ref 0.0–5.0)
HEMATOCRIT: 38.1 % — AB (ref 39.0–52.0)
Hemoglobin: 12.6 g/dL — ABNORMAL LOW (ref 13.0–17.0)
Lymphocytes Relative: 26.9 % (ref 12.0–46.0)
Lymphs Abs: 1.1 10*3/uL (ref 0.7–4.0)
MCHC: 33 g/dL (ref 30.0–36.0)
MCV: 82.4 fl (ref 78.0–100.0)
MONOS PCT: 9.2 % (ref 3.0–12.0)
Monocytes Absolute: 0.4 10*3/uL (ref 0.1–1.0)
NEUTROS ABS: 2.6 10*3/uL (ref 1.4–7.7)
Neutrophils Relative %: 61.1 % (ref 43.0–77.0)
PLATELETS: 283 10*3/uL (ref 150.0–400.0)
RBC: 4.62 Mil/uL (ref 4.22–5.81)
RDW: 14.2 % (ref 11.5–15.5)
WBC: 4.2 10*3/uL (ref 4.0–10.5)

## 2017-12-30 LAB — T4, FREE: FREE T4: 1.48 ng/dL (ref 0.60–1.60)

## 2017-12-30 LAB — BASIC METABOLIC PANEL
BUN: 14 mg/dL (ref 6–23)
CALCIUM: 9.8 mg/dL (ref 8.4–10.5)
CO2: 30 meq/L (ref 19–32)
Chloride: 103 mEq/L (ref 96–112)
Creatinine, Ser: 1.06 mg/dL (ref 0.40–1.50)
GFR: 76 mL/min (ref >60.00)
Glucose, Bld: 96 mg/dL (ref 70–99)
Potassium: 4.7 mEq/L (ref 3.5–5.1)
SODIUM: 140 meq/L (ref 135–145)

## 2017-12-30 LAB — URINALYSIS
Bilirubin Urine: NEGATIVE
Hgb urine dipstick: NEGATIVE
KETONES UR: NEGATIVE
Leukocytes, UA: NEGATIVE
Nitrite: NEGATIVE
PH: 7.5 (ref 5.0–8.0)
SPECIFIC GRAVITY, URINE: 1.015 (ref 1.000–1.030)
Total Protein, Urine: NEGATIVE
URINE GLUCOSE: NEGATIVE
Urobilinogen, UA: 0.2 (ref 0.0–1.0)

## 2017-12-30 LAB — LIPID PANEL
CHOLESTEROL: 133 mg/dL (ref 0–200)
HDL: 32 mg/dL — ABNORMAL LOW (ref >39.00)
LDL CALC: 85 mg/dL (ref 0–99)
NonHDL: 100.64
TRIGLYCERIDES: 78 mg/dL (ref 0.0–149.0)
Total CHOL/HDL Ratio: 4
VLDL: 15.6 mg/dL (ref 0.0–40.0)

## 2017-12-30 LAB — HEPATIC FUNCTION PANEL
ALBUMIN: 4.5 g/dL (ref 3.5–5.2)
ALT: 10 U/L (ref 0–53)
AST: 13 U/L (ref 0–37)
Alkaline Phosphatase: 74 U/L (ref 39–117)
Bilirubin, Direct: 0.1 mg/dL (ref 0.0–0.3)
TOTAL PROTEIN: 7.2 g/dL (ref 6.0–8.3)
Total Bilirubin: 0.5 mg/dL (ref 0.2–1.2)

## 2017-12-30 LAB — TSH: TSH: 0.05 u[IU]/mL — ABNORMAL LOW (ref 0.35–4.50)

## 2017-12-30 LAB — PSA: PSA: 1.73 ng/mL (ref 0.10–4.00)

## 2018-01-05 ENCOUNTER — Encounter: Payer: BLUE CROSS/BLUE SHIELD | Admitting: Internal Medicine

## 2018-01-06 ENCOUNTER — Encounter: Payer: Self-pay | Admitting: Internal Medicine

## 2018-01-06 ENCOUNTER — Ambulatory Visit (INDEPENDENT_AMBULATORY_CARE_PROVIDER_SITE_OTHER): Payer: BLUE CROSS/BLUE SHIELD | Admitting: Internal Medicine

## 2018-01-06 ENCOUNTER — Other Ambulatory Visit: Payer: Self-pay | Admitting: Internal Medicine

## 2018-01-06 VITALS — BP 132/82 | HR 73 | Temp 98.2°F | Ht 72.25 in | Wt 193.0 lb

## 2018-01-06 DIAGNOSIS — E034 Atrophy of thyroid (acquired): Secondary | ICD-10-CM | POA: Diagnosis not present

## 2018-01-06 DIAGNOSIS — R972 Elevated prostate specific antigen [PSA]: Secondary | ICD-10-CM | POA: Diagnosis not present

## 2018-01-06 DIAGNOSIS — E785 Hyperlipidemia, unspecified: Secondary | ICD-10-CM

## 2018-01-06 DIAGNOSIS — Z23 Encounter for immunization: Secondary | ICD-10-CM

## 2018-01-06 DIAGNOSIS — D649 Anemia, unspecified: Secondary | ICD-10-CM

## 2018-01-06 DIAGNOSIS — Z0000 Encounter for general adult medical examination without abnormal findings: Secondary | ICD-10-CM | POA: Diagnosis not present

## 2018-01-06 NOTE — Progress Notes (Signed)
Subjective:  Patient ID: Derrick Walters, male    DOB: 12-21-1960  Age: 57 y.o. MRN: [id number]  CC: No chief complaint on file.   HPI Derrick Walters presents for a well exam Planning a mission trip+  Outpatient Medications Prior to Visit  Medication Sig Dispense Refill  . acyclovir (ZOVIRAX) 200 MG capsule Take 200 mg by mouth daily.      Marland Kitchen aspirin 81 MG EC tablet Take 81 mg by mouth daily.      . Cholecalciferol 1000 UNITS tablet Take 1,000 Units by mouth daily.      . Flaxseed, Linseed, (FLAX SEEDS PO) Take by mouth.    . SYNTHROID 150 MCG tablet TAKE 1 TABLET DAILY BEFORE BREAKFAST 90 tablet 2  . SYNTHROID 150 MCG tablet TAKE 1 TABLET DAILY BEFORE BREAKFAST 90 tablet 2   No facility-administered medications prior to visit.     ROS: Review of Systems  Constitutional: Negative for appetite change, fatigue and unexpected weight change.  HENT: Negative for congestion, nosebleeds, sneezing, sore throat and trouble swallowing.   Eyes: Negative for itching and visual disturbance.  Respiratory: Negative for cough.   Cardiovascular: Negative for chest pain, palpitations and leg swelling.  Gastrointestinal: Negative for abdominal distention, blood in stool, diarrhea and nausea.  Genitourinary: Negative for frequency and hematuria.  Musculoskeletal: Negative for back pain, gait problem, joint swelling and neck pain.  Skin: Negative for rash.  Neurological: Negative for dizziness, tremors, speech difficulty and weakness.  Psychiatric/Behavioral: Negative for agitation, dysphoric mood, sleep disturbance and suicidal ideas. The patient is not nervous/anxious.     Objective:  BP 132/82 (BP Location: Left Arm, Patient Position: Sitting, Cuff Size: Normal)   Pulse 73   Temp 98.2 F (36.8 C) (Oral)   Ht 6' 0.25" (1.835 m)   Wt 193 lb (87.5 kg)   SpO2 99%   BMI 25.99 kg/m   BP Readings from Last 3 Encounters:  01/06/18 132/82  01/07/17 134/78  12/31/16 122/80    Wt Readings  from Last 3 Encounters:  01/06/18 193 lb (87.5 kg)  01/07/17 199 lb (90.3 kg)  12/31/16 196 lb (88.9 kg)    Physical Exam  Constitutional: He is oriented to person, place, and time. He appears well-developed. No distress.  NAD  HENT:  Mouth/Throat: Oropharynx is clear and moist.  Eyes: Pupils are equal, round, and reactive to light. Conjunctivae are normal.  Neck: Normal range of motion. No JVD present. No thyromegaly present.  Cardiovascular: Normal rate, regular rhythm, normal heart sounds and intact distal pulses. Exam reveals no gallop and no friction rub.  No murmur heard. Pulmonary/Chest: Effort normal and breath sounds normal. No respiratory distress. He has no wheezes. He has no rales. He exhibits no tenderness.  Abdominal: Soft. Bowel sounds are normal. He exhibits no distension and no mass. There is no tenderness. There is no rebound and no guarding.  Genitourinary: Rectum normal. Rectal exam shows guaiac negative stool.  Musculoskeletal: Normal range of motion. He exhibits no edema or tenderness.  Lymphadenopathy:    He has no cervical adenopathy.  Neurological: He is alert and oriented to person, place, and time. He has normal reflexes. No cranial nerve deficit. He exhibits normal muscle tone. He displays a negative Romberg sign. Coordination and gait normal.  Skin: Skin is warm and dry. No rash noted.  Psychiatric: He has a normal mood and affect. His behavior is normal. Judgment and thought content normal.  prostate 1+  Lab Results  Component Value Date   WBC 4.2 12/30/2017   HGB 12.6 (L) 12/30/2017   HCT 38.1 (L) 12/30/2017   PLT 283.0 12/30/2017   GLUCOSE 96 12/30/2017   CHOL 133 12/30/2017   TRIG 78.0 12/30/2017   HDL 32.00 (L) 12/30/2017   LDLCALC 85 12/30/2017   ALT 10 12/30/2017   AST 13 12/30/2017   NA 140 12/30/2017   K 4.7 12/30/2017   CL 103 12/30/2017   CREATININE 1.06 12/30/2017   BUN 14 12/30/2017   CO2 30 12/30/2017   TSH 0.05 (L) 12/30/2017    PSA 1.73 12/30/2017    No results found.  Assessment & Plan:   There are no diagnoses linked to this encounter.   No orders of the defined types were placed in this encounter.    Follow-up: No follow-ups on file.  Walker Kehr, MD

## 2018-01-06 NOTE — Addendum Note (Signed)
Addended by: Karren Cobble on: 01/06/2018 08:45 AM   Modules accepted: Orders

## 2018-01-06 NOTE — Patient Instructions (Signed)
Derrick Walters

## 2018-01-06 NOTE — Assessment & Plan Note (Signed)
PSA

## 2018-01-06 NOTE — Assessment & Plan Note (Addendum)
On Levoxyl TSH low FT4 nl

## 2018-01-06 NOTE — Assessment & Plan Note (Signed)
Labs

## 2018-01-06 NOTE — Assessment & Plan Note (Signed)
We discussed age appropriate health related issues, including available/recomended screening tests and vaccinations. We discussed a need for adhering to healthy diet and exercise. Labs/EKG were reviewed/ordered. All questions were answered.  Colon due 2021 Shingrix discussed

## 2018-05-17 ENCOUNTER — Emergency Department (HOSPITAL_COMMUNITY): Payer: BC Managed Care – PPO

## 2018-05-17 ENCOUNTER — Other Ambulatory Visit: Payer: Self-pay

## 2018-05-17 ENCOUNTER — Inpatient Hospital Stay (HOSPITAL_COMMUNITY)
Admission: EM | Admit: 2018-05-17 | Discharge: 2018-05-21 | DRG: 603 | Disposition: A | Discharge status: Home / Self Care | Payer: BC Managed Care – PPO | Attending: Family Medicine | Admitting: Family Medicine

## 2018-05-17 ENCOUNTER — Encounter (HOSPITAL_COMMUNITY): Payer: Self-pay | Admitting: Emergency Medicine

## 2018-05-17 DIAGNOSIS — Z87442 Personal history of urinary calculi: Secondary | ICD-10-CM

## 2018-05-17 DIAGNOSIS — R21 Rash and other nonspecific skin eruption: Secondary | ICD-10-CM | POA: Diagnosis present

## 2018-05-17 DIAGNOSIS — D509 Iron deficiency anemia, unspecified: Secondary | ICD-10-CM | POA: Diagnosis present

## 2018-05-17 DIAGNOSIS — D649 Anemia, unspecified: Secondary | ICD-10-CM | POA: Diagnosis present

## 2018-05-17 DIAGNOSIS — Z79899 Other long term (current) drug therapy: Secondary | ICD-10-CM

## 2018-05-17 DIAGNOSIS — L03115 Cellulitis of right lower limb: Principal | ICD-10-CM | POA: Diagnosis present

## 2018-05-17 DIAGNOSIS — E039 Hypothyroidism, unspecified: Secondary | ICD-10-CM | POA: Diagnosis present

## 2018-05-17 DIAGNOSIS — Z7982 Long term (current) use of aspirin: Secondary | ICD-10-CM

## 2018-05-17 DIAGNOSIS — R609 Edema, unspecified: Secondary | ICD-10-CM | POA: Diagnosis present

## 2018-05-17 DIAGNOSIS — E785 Hyperlipidemia, unspecified: Secondary | ICD-10-CM | POA: Diagnosis present

## 2018-05-17 DIAGNOSIS — Z79890 Hormone replacement therapy (postmenopausal): Secondary | ICD-10-CM

## 2018-05-17 DIAGNOSIS — L03119 Cellulitis of unspecified part of limb: Secondary | ICD-10-CM | POA: Diagnosis present

## 2018-05-17 LAB — BASIC METABOLIC PANEL
Anion gap: 10 (ref 5–15)
BUN: 21 mg/dL — ABNORMAL HIGH (ref 6–20)
CALCIUM: 8.7 mg/dL — AB (ref 8.9–10.3)
CO2: 22 mmol/L (ref 22–32)
CREATININE: 1.24 mg/dL (ref 0.61–1.24)
Chloride: 103 mmol/L (ref 98–111)
GFR calc Af Amer: >60 mL/min (ref >60)
GFR calc non Af Amer: >60 mL/min (ref >60)
Glucose, Bld: 104 mg/dL — ABNORMAL HIGH (ref 70–99)
Potassium: 3.9 mmol/L (ref 3.5–5.1)
Sodium: 135 mmol/L (ref 135–145)

## 2018-05-17 LAB — CBC WITH DIFFERENTIAL/PLATELET
Abs Immature Granulocytes: 0.21 10*3/uL — ABNORMAL HIGH (ref 0.00–0.07)
BASOS ABS: 0 10*3/uL (ref 0.0–0.1)
Basophils Relative: 0 %
Eosinophils Absolute: 0 10*3/uL (ref 0.0–0.5)
Eosinophils Relative: 0 %
HCT: 36 % — ABNORMAL LOW (ref 39.0–52.0)
Hemoglobin: 11.2 g/dL — ABNORMAL LOW (ref 13.0–17.0)
Immature Granulocytes: 1 %
Lymphocytes Relative: 6 %
Lymphs Abs: 0.9 10*3/uL (ref 0.7–4.0)
MCH: 26.7 pg (ref 26.0–34.0)
MCHC: 31.1 g/dL (ref 30.0–36.0)
MCV: 85.7 fL (ref 80.0–100.0)
Monocytes Absolute: 0.8 10*3/uL (ref 0.1–1.0)
Monocytes Relative: 5 %
Neutro Abs: 14.7 10*3/uL — ABNORMAL HIGH (ref 1.7–7.7)
Neutrophils Relative %: 88 %
PLATELETS: 292 10*3/uL (ref 150–400)
RBC: 4.2 MIL/uL — ABNORMAL LOW (ref 4.22–5.81)
RDW: 17 % — AB (ref 11.5–15.5)
WBC: 16.7 10*3/uL — ABNORMAL HIGH (ref 4.0–10.5)
nRBC: 0 % (ref 0.0–0.2)

## 2018-05-17 MED ORDER — SODIUM CHLORIDE 0.9 % IV SOLN
1.0000 g | Freq: Once | INTRAVENOUS | Status: AC
  Administered 2018-05-17: 1 g via INTRAVENOUS
  Filled 2018-05-17: qty 10

## 2018-05-17 NOTE — ED Triage Notes (Signed)
Pt states R great toe became infected, no known injury or bite. Was seen at urgent care yesterday and given Rocephin IM and Bactrim PO. Increased redness, swelling, and pain with ambulation. Denies fever, N/V/D/.

## 2018-05-17 NOTE — ED Provider Notes (Signed)
[facility] EMERGENCY DEPARTMENT Provider Note   CSN: [id number] Arrival date & time: 05/17/18  2047    History   Chief Complaint Chief Complaint  Patient presents with  . Cellulitis    HPI Derrick Walters is a 58 y.o. male presenting with worsening redness, swelling and pain of his right lower leg.  He reports initially developing an infection of his right great toe one week ago which he was soaking in epsom salt water and it was starting to feel improved, then 2 days ago at work he knelt down on the right knee and noticed it was painful and swollen.  By that evening he had redness and swelling which had spread to his ankle.  He denies injury to the extremity or wounds to his skin. He was seen at an urgent care center yesterday and was given an IM dose of rocephin and placed on bactrim which he started last night.     The history is provided by the patient.    Past Medical History:  Diagnosis Date  . Anemia   . 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 History of nephrolithiasis   . Hyperlipidemia   . Hypothyroidism     Patient Active Problem List   Diagnosis Date Noted  . Laryngitis 01/08/2017  . Elevated PSA 12/31/2016  . Tick bite, infected 08/14/2013  . Anemia 11/16/2011  . Well adult exam 10/07/2010  . Dyslipidemia 10/07/2010  . Neoplasm of uncertain behavior of skin 10/07/2010  . Hypothyroidism 08/29/2007  . NEPHROLITHIASIS, HX OF 08/29/2007    Past Surgical History:  Procedure Laterality Date  . TONSILLECTOMY          Home Medications    Prior to Admission medications   Medication Sig Start Date End Date Taking? Authorizing Provider  acyclovir (ZOVIRAX) 200 MG capsule Take 200 mg by mouth daily.      [provider]  aspirin 81 MG EC tablet Take 81 mg by mouth daily.      [provider]  Cholecalciferol 1000 UNITS tablet Take 1,000 Units by mouth daily.      [provider]  Flaxseed, Linseed, (FLAX SEEDS PO) Take by mouth.    [provider]  levothyroxine (SYNTHROID, LEVOTHROID) 150 MCG tablet TAKE 1 TABLET DAILY BEFORE BREAKFAST 01/06/18   Plotnikov, Evie Lacks, MD  SYNTHROID 150 MCG tablet TAKE 1 TABLET DAILY BEFORE BREAKFAST 05/31/16   Plotnikov, Evie Lacks, MD  SYNTHROID 150 MCG tablet TAKE 1 TABLET DAILY BEFORE BREAKFAST 02/17/17   Plotnikov, Evie Lacks, MD    Family History Family History  Problem Relation Age of Onset  . Depression Father        committed suicide at age 12  . Suicidality Father   . COPD Mother   . Lung cancer Mother   . Depression Brother     Social History Social History   Tobacco Use  . Smoking status: Never Smoker  . Smokeless tobacco: Never Used  Substance Use Topics  . Alcohol use: Yes    Comment: less than 1 per day  . Drug use: No     Allergies   Patient has no known allergies.   Review of Systems Review of Systems  Constitutional: Negative for chills and fever.  Musculoskeletal: Positive for arthralgias and joint swelling. Negative for myalgias.  Skin: Positive for color change. Negative for wound.  Neurological: Negative for weakness and numbness.     Physical Exam Updated Vital  Signs BP 129/80   Pulse 89   Temp 99.6 F (37.6 C) (Oral)   Resp 16   Ht 6' (1.829 m)   Wt 85.3 kg   SpO2 100%   BMI 25.50 kg/m   Physical Exam Constitutional:      General: He is not in acute distress.    Appearance: Normal appearance. He is well-developed.  HENT:     Head: Normocephalic.  Neck:     Musculoskeletal: Neck supple.  Cardiovascular:     Rate and Rhythm: Normal rate.     Pulses:          Dorsalis pedis pulses are 2+ on the right side and 2+ on the left side.  Pulmonary:     Effort: Pulmonary effort is normal.     Breath sounds: No wheezing.  Musculoskeletal:        General: Swelling present.     Right knee: He exhibits swelling and erythema.     Comments: Erythema with edema of the right patella through ankle. The foot is not significantly affected,  but the right great toe is erythematous with desquamation and erythematous around the cuticle and distal toe.   Skin:    Findings: Rash present.  Neurological:     Mental Status: He is alert.      ED Treatments / Results  Labs (all labs ordered are listed, but only abnormal results are displayed) Labs Reviewed  CBC WITH DIFFERENTIAL/PLATELET - Abnormal; Notable for the following components:      Result Value   WBC 16.7 (*)    RBC 4.20 (*)    Hemoglobin 11.2 (*)    HCT 36.0 (*)    RDW 17.0 (*)    Neutro Abs 14.7 (*)    Abs Immature Granulocytes 0.21 (*)    All other components within normal limits  BASIC METABOLIC PANEL - Abnormal; Notable for the following components:   Glucose, Bld 104 (*)    BUN 21 (*)    Calcium 8.7 (*)    All other components within normal limits    EKG None  Radiology Dg Knee Complete 4 Views Right  Result Date: 05/17/2018 CLINICAL DATA:  Swelling and erythema EXAM: RIGHT KNEE - COMPLETE 4+ VIEW COMPARISON:  None. FINDINGS: No fracture or malalignment. Joint space calcifications. Trace knee effusion. Diffuse soft tissue swelling without emphysema IMPRESSION: 1. No acute osseous abnormality. 2. Trace knee effusion 3. Chondrocalcinosis Electronically Signed   By: Donavan Foil M.D.   On: 05/17/2018 23:22   Dg Toe Great Right  Result Date: 05/17/2018 CLINICAL DATA:  Total infection EXAM: RIGHT GREAT TOE COMPARISON:  None. FINDINGS: There is no evidence of fracture or dislocation. There is no evidence of arthropathy or other focal bone abnormality. No soft tissue emphysema. IMPRESSION: No acute osseous abnormality Electronically Signed   By: Donavan Foil M.D.   On: 05/17/2018 23:21    Procedures Procedures (including critical care time)  Medications Ordered in ED Medications  cefTRIAXone (ROCEPHIN) 1 g in sodium chloride 0.9 % 100 mL IVPB (0 g Intravenous Stopped 05/17/18 2243)     Initial Impression / Assessment and Plan / ED Course  I have  reviewed the triage vital signs and the nursing notes.  Pertinent labs & imaging results that were available during my care of the patient were reviewed by me and considered in my medical decision making (see chart for details).        Pt with worsening right lower extremity cellulitis  despite rocephin and bactrim abx with leukocytosis.  Pt would benefit from further IV abx and is agreeable to admission.  Discussed with Dr Olevia Bowens who accepts pt for admission.  Pt seen by Dr Tomi Bamberger during this visit.   Final Clinical Impressions(s) / ED Diagnoses   Final diagnoses:  Cellulitis of right lower extremity    ED Discharge Orders    None       Landis Martins 05/17/18 2350    Dorie Rank, MD 05/19/18 [phone]

## 2018-05-18 ENCOUNTER — Observation Stay (HOSPITAL_COMMUNITY): Payer: BC Managed Care – PPO

## 2018-05-18 ENCOUNTER — Other Ambulatory Visit: Payer: Self-pay

## 2018-05-18 ENCOUNTER — Encounter (HOSPITAL_COMMUNITY): Payer: Self-pay | Admitting: *Deleted

## 2018-05-18 DIAGNOSIS — L03115 Cellulitis of right lower limb: Secondary | ICD-10-CM | POA: Diagnosis present

## 2018-05-18 DIAGNOSIS — R609 Edema, unspecified: Secondary | ICD-10-CM | POA: Diagnosis present

## 2018-05-18 DIAGNOSIS — D649 Anemia, unspecified: Secondary | ICD-10-CM | POA: Diagnosis not present

## 2018-05-18 DIAGNOSIS — E785 Hyperlipidemia, unspecified: Secondary | ICD-10-CM | POA: Diagnosis present

## 2018-05-18 DIAGNOSIS — Z87442 Personal history of urinary calculi: Secondary | ICD-10-CM | POA: Diagnosis not present

## 2018-05-18 DIAGNOSIS — E034 Atrophy of thyroid (acquired): Secondary | ICD-10-CM | POA: Diagnosis not present

## 2018-05-18 DIAGNOSIS — Z79890 Hormone replacement therapy (postmenopausal): Secondary | ICD-10-CM | POA: Diagnosis not present

## 2018-05-18 DIAGNOSIS — D509 Iron deficiency anemia, unspecified: Secondary | ICD-10-CM | POA: Diagnosis present

## 2018-05-18 DIAGNOSIS — Z7982 Long term (current) use of aspirin: Secondary | ICD-10-CM | POA: Diagnosis not present

## 2018-05-18 DIAGNOSIS — E039 Hypothyroidism, unspecified: Secondary | ICD-10-CM | POA: Diagnosis present

## 2018-05-18 DIAGNOSIS — R21 Rash and other nonspecific skin eruption: Secondary | ICD-10-CM | POA: Diagnosis present

## 2018-05-18 DIAGNOSIS — Z79899 Other long term (current) drug therapy: Secondary | ICD-10-CM | POA: Diagnosis not present

## 2018-05-18 LAB — BASIC METABOLIC PANEL
Anion gap: 6 (ref 5–15)
BUN: 17 mg/dL (ref 6–20)
CO2: 24 mmol/L (ref 22–32)
Calcium: 8.5 mg/dL — ABNORMAL LOW (ref 8.9–10.3)
Chloride: 107 mmol/L (ref 98–111)
Creatinine, Ser: 1.12 mg/dL (ref 0.61–1.24)
GFR calc Af Amer: >60 mL/min (ref >60)
GFR calc non Af Amer: >60 mL/min (ref >60)
GLUCOSE: 99 mg/dL (ref 70–99)
Potassium: 3.8 mmol/L (ref 3.5–5.1)
Sodium: 137 mmol/L (ref 135–145)

## 2018-05-18 LAB — CBC WITH DIFFERENTIAL/PLATELET
ABS IMMATURE GRANULOCYTES: 0.16 10*3/uL — AB (ref 0.00–0.07)
BASOS ABS: 0 10*3/uL (ref 0.0–0.1)
Basophils Relative: 0 %
Eosinophils Absolute: 0.1 10*3/uL (ref 0.0–0.5)
Eosinophils Relative: 1 %
HCT: 34.8 % — ABNORMAL LOW (ref 39.0–52.0)
Hemoglobin: 10.5 g/dL — ABNORMAL LOW (ref 13.0–17.0)
IMMATURE GRANULOCYTES: 1 %
Lymphocytes Relative: 8 %
Lymphs Abs: 1.1 10*3/uL (ref 0.7–4.0)
MCH: 26.1 pg (ref 26.0–34.0)
MCHC: 30.2 g/dL (ref 30.0–36.0)
MCV: 86.4 fL (ref 80.0–100.0)
Monocytes Absolute: 0.8 10*3/uL (ref 0.1–1.0)
Monocytes Relative: 6 %
NEUTROS ABS: 11.7 10*3/uL — AB (ref 1.7–7.7)
Neutrophils Relative %: 84 %
Platelets: 281 10*3/uL (ref 150–400)
RBC: 4.03 MIL/uL — ABNORMAL LOW (ref 4.22–5.81)
RDW: 17 % — ABNORMAL HIGH (ref 11.5–15.5)
WBC: 13.8 10*3/uL — ABNORMAL HIGH (ref 4.0–10.5)
nRBC: 0 % (ref 0.0–0.2)

## 2018-05-18 LAB — HEPATIC FUNCTION PANEL
ALT: 15 U/L (ref 0–44)
AST: 21 U/L (ref 15–41)
Albumin: 3.7 g/dL (ref 3.5–5.0)
Alkaline Phosphatase: 84 U/L (ref 38–126)
Bilirubin, Direct: 0.1 mg/dL (ref 0.0–0.2)
Indirect Bilirubin: 0.4 mg/dL (ref 0.3–0.9)
Total Bilirubin: 0.5 mg/dL (ref 0.3–1.2)
Total Protein: 8 g/dL (ref 6.5–8.1)

## 2018-05-18 LAB — MAGNESIUM: Magnesium: 2 mg/dL (ref 1.7–2.4)

## 2018-05-18 MED ORDER — ENOXAPARIN SODIUM 40 MG/0.4ML ~~LOC~~ SOLN
40.0000 mg | SUBCUTANEOUS | Status: DC
  Filled 2018-05-18: qty 0.4

## 2018-05-18 MED ORDER — LEVOTHYROXINE SODIUM 75 MCG PO TABS
150.0000 ug | ORAL_TABLET | Freq: Every day | ORAL | Status: DC
  Administered 2018-05-18 – 2018-05-21 (×4): 150 ug via ORAL
  Filled 2018-05-18 (×4): qty 2

## 2018-05-18 MED ORDER — ACETAMINOPHEN 650 MG RE SUPP: 650.0000 mg | Freq: Four times a day (QID) | RECTAL | Status: DC | PRN

## 2018-05-18 MED ORDER — KETOROLAC TROMETHAMINE 30 MG/ML IJ SOLN: 30.0000 mg | Freq: Four times a day (QID) | INTRAMUSCULAR | Status: DC | PRN

## 2018-05-18 MED ORDER — OXYCODONE HCL 5 MG PO TABS
5.0000 mg | ORAL_TABLET | ORAL | Status: DC | PRN
  Administered 2018-05-18: 5 mg via ORAL
  Filled 2018-05-18: qty 1

## 2018-05-18 MED ORDER — ACETAMINOPHEN 325 MG PO TABS
650.0000 mg | ORAL_TABLET | Freq: Four times a day (QID) | ORAL | Status: DC | PRN
  Administered 2018-05-19: 650 mg via ORAL
  Filled 2018-05-18: qty 2

## 2018-05-18 MED ORDER — CEFAZOLIN SODIUM-DEXTROSE 1-4 GM/50ML-% IV SOLN
1.0000 g | Freq: Three times a day (TID) | INTRAVENOUS | Status: DC
  Administered 2018-05-18 – 2018-05-21 (×9): 1 g via INTRAVENOUS
  Filled 2018-05-18 (×13): qty 50

## 2018-05-18 MED ORDER — SODIUM CHLORIDE 0.9 % IV SOLN
INTRAVENOUS | Status: DC
  Administered 2018-05-18 – 2018-05-20 (×5): via INTRAVENOUS

## 2018-05-18 MED ORDER — ACYCLOVIR 200 MG PO CAPS
200.0000 mg | ORAL_CAPSULE | Freq: Every day | ORAL | Status: DC
  Administered 2018-05-18 – 2018-05-21 (×4): 200 mg via ORAL
  Filled 2018-05-18 (×4): qty 1

## 2018-05-18 MED ORDER — SODIUM CHLORIDE 0.9 % IV SOLN: 1.0000 g | INTRAVENOUS | Status: DC

## 2018-05-18 NOTE — Progress Notes (Signed)
Patient admitted to the hospital earlier this morning by Dr. Olevia Bowens.    Patient seen and examined.  Continues to have swelling, erythema and pain in his right lower extremity.  He is able to bend his knee significantly without discomfort.  He was admitted to the hospital with right lower extremity cellulitis.  Currently on intravenous antibiotics.  Check lower extremity Dopplers to rule out DVT.  Plain films of right leg and right great toe did not show any underlying injuries.  We will continue current treatments.  [employer]

## 2018-05-18 NOTE — H&P (Signed)
History and Physical    Derrick Walters NLZ:[id number] DOB: Dec 30, 1960 DOA: 05/17/2018  PCP: Cassandria Anger, MD   Patient coming from: Home.  I have personally briefly reviewed patient's old medical records in [place]  Chief Complaint: Right great toe infection.  HPI: Derrick Walters is a 58 y.o. male with medical history significant of iron deficiency anemia, nephrolithiasis, hyperlipidemia, hypothyroidism who is coming to the emergency department with complaints of having a progressively worse right toe infection which has been painful and swollen.  He has been soaking it in Epsom salt water for the past few days.  Yesterday he went to an urgent care center and received Rocephin IM and was given a prescription for oral Bactrim.  However, the patient presents today with progressively worse redness and edema over his right pretibial area all the way up to the knee.  He states the skin around his knee and upper pretibial area has become very warm, tender, erythematous and edematous.  He denies fever, chills, night sweats, but feels a little fatigued.  No rhinorrhea, sore throat, cough, wheezing or hemoptysis.  Denies chest pain, palpitations, dizziness, diaphoresis, PND or orthopnea.  No abdominal pain, nausea, emesis, diarrhea, constipation, melena or hematochezia.  No dysuria, frequency or hematuria.  Denies polyuria, polydipsia, polyphagia or blurred vision.  ED Course: Initial vital signs temperature 99.6 F, pulse 91, respirations 16, blood pressure 125/72 mmHg and O2 sat 100% on room air.  The patient was given 1 g of ceftriaxone in the emergency department.  Having a progressively worse his white count was 16.7, hemoglobin 11.2 g/dL and platelets 292.  Hepatic function tests were within normal limits.  Basic metabolic profile showed glucose 104, BUN 21, creatinine 1.24 and calcium 8.7 mg/dL.  Normal calcium when corrected to albumin.  The rest of the BMP electrolytes are within  normal limits.   Imaging: his knee x-ray shows no acute osseous abnormality.  There is a trace knee effusion.  There is chondrocalcinosis.  Right great toe x-ray does not show any fracture or dislocation.  Or soft tissue emphysema.  Review of Systems: As per HPI otherwise 10 point review of systems negative.   Past Medical History:  Diagnosis Date  . Anemia   . 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 History of nephrolithiasis   . Hyperlipidemia   . Hypothyroidism     Past Surgical History:  Procedure Laterality Date  . TONSILLECTOMY       reports that he has never smoked. He has never used smokeless tobacco. He reports current alcohol use. He reports that he does not use drugs.  No Known Allergies  Family History  Problem Relation Age of Onset  . Depression Father        committed suicide at age 68  . Suicidality Father   . COPD Mother   . Lung cancer Mother   . Depression Brother    Prior to Admission medications   Medication Sig Start Date End Date Taking? Authorizing Provider  acyclovir (ZOVIRAX) 200 MG capsule Take 200 mg by mouth daily.      [provider]  aspirin 81 MG EC tablet Take 81 mg by mouth daily.      [provider]  Cholecalciferol 1000 UNITS tablet Take 1,000 Units by mouth daily.      [provider]  Flaxseed, Linseed, (FLAX SEEDS PO) Take by mouth.    [provider]  levothyroxine (SYNTHROID, [place])  150 MCG tablet TAKE 1 TABLET DAILY BEFORE BREAKFAST 01/06/18   Plotnikov, Evie Lacks, MD  SYNTHROID 150 MCG tablet TAKE 1 TABLET DAILY BEFORE BREAKFAST 05/31/16   Plotnikov, Evie Lacks, MD  SYNTHROID 150 MCG tablet TAKE 1 TABLET DAILY BEFORE BREAKFAST 02/17/17   Plotnikov, Evie Lacks, MD    Physical Exam: Vitals:   05/17/18 2200 05/17/18 2230 05/18/18 0024 05/18/18 0033  BP: 129/80 127/80 132/66 134/70  Pulse: 89 84 84 85  Resp:   18 16  Temp:   98.4 F (36.9 C) 98.6 F (37 C)  TempSrc:   Oral Oral  SpO2: 100% 100% 100%  100%  Weight:      Height:        Constitutional: NAD, calm, comfortable Eyes: PERRL, lids and conjunctivae normal ENMT: Mucous membranes are moist. Posterior pharynx clear of any exudate or lesions. Neck: normal, supple, no masses, no thyromegaly Respiratory: clear to auscultation bilaterally, no wheezing, no crackles. Normal respiratory effort. No accessory muscle use.  Cardiovascular: Regular rate and rhythm, no murmurs / rubs / gallops. No extremity edema. 2+ pedal pulses. No carotid bruits.  Abdomen: no tenderness, no masses palpated. No hepatosplenomegaly. Bowel sounds positive.  Musculoskeletal: no clubbing / cyanosis. Good ROM, no contractures. Normal muscle tone.  Skin: no rashes, lesions, ulcers. No induration Neurologic: CN 2-12 grossly intact. Sensation intact, DTR normal. Strength 5/5 in all 4.  Psychiatric: Normal judgment and insight. Alert and oriented x 3. Normal mood.   Labs on Admission: I have personally reviewed following labs and imaging studies  CBC: Recent Labs  Lab 05/17/18 2148  WBC 16.7*  NEUTROABS 14.7*  HGB 11.2*  HCT 36.0*  MCV 85.7  PLT 818   Basic Metabolic Panel: Recent Labs  Lab 05/17/18 2148  NA 135  K 3.9  CL 103  CO2 22  GLUCOSE 104*  BUN 21*  CREATININE 1.24  CALCIUM 8.7*  MG 2.0   GFR: Estimated Creatinine Clearance: 70.4 mL/min (by C-G formula based on SCr of 1.24 mg/dL). Liver Function Tests: Recent Labs  Lab 05/17/18 2148  AST 21  ALT 15  ALKPHOS 84  BILITOT 0.5  PROT 8.0  ALBUMIN 3.7   No results for input(s): LIPASE, AMYLASE in the last 168 hours. No results for input(s): AMMONIA in the last 168 hours. Coagulation Profile: No results for input(s): INR, PROTIME in the last 168 hours. Cardiac Enzymes: No results for input(s): CKTOTAL, CKMB, CKMBINDEX, TROPONINI in the last 168 hours. BNP (last 3 results) No results for input(s): PROBNP in the last 8760 hours. HbA1C: No results for input(s): HGBA1C in the last  72 hours. CBG: No results for input(s): GLUCAP in the last 168 hours. Lipid Profile: No results for input(s): CHOL, HDL, LDLCALC, TRIG, CHOLHDL, LDLDIRECT in the last 72 hours. Thyroid Function Tests: No results for input(s): TSH, T4TOTAL, FREET4, T3FREE, THYROIDAB in the last 72 hours. Anemia Panel: No results for input(s): VITAMINB12, FOLATE, FERRITIN, TIBC, IRON, RETICCTPCT in the last 72 hours. Urine analysis:    Component Value Date/Time   COLORURINE YELLOW 12/30/2017 1127   APPEARANCEUR CLEAR 12/30/2017 1127   LABSPEC 1.015 12/30/2017 1127   PHURINE 7.5 12/30/2017 1127   GLUCOSEU NEGATIVE 12/30/2017 1127   HGBUR NEGATIVE 12/30/2017 1127   BILIRUBINUR NEGATIVE 12/30/2017 1127   KETONESUR NEGATIVE 12/30/2017 1127   UROBILINOGEN 0.2 12/30/2017 1127   NITRITE NEGATIVE 12/30/2017 1127   LEUKOCYTESUR NEGATIVE 12/30/2017 1127    Radiological Exams on Admission: Dg Knee Complete 4 Views  Right  Result Date: 05/17/2018 CLINICAL DATA:  Swelling and erythema EXAM: RIGHT KNEE - COMPLETE 4+ VIEW COMPARISON:  None. FINDINGS: No fracture or malalignment. Joint space calcifications. Trace knee effusion. Diffuse soft tissue swelling without emphysema IMPRESSION: 1. No acute osseous abnormality. 2. Trace knee effusion 3. Chondrocalcinosis Electronically Signed   By: Donavan Foil M.D.   On: 05/17/2018 23:22   Dg Toe Great Right  Result Date: 05/17/2018 CLINICAL DATA:  Total infection EXAM: RIGHT GREAT TOE COMPARISON:  None. FINDINGS: There is no evidence of fracture or dislocation. There is no evidence of arthropathy or other focal bone abnormality. No soft tissue emphysema. IMPRESSION: No acute osseous abnormality Electronically Signed   By: Donavan Foil M.D.   On: 05/17/2018 23:21    EKG: Independently reviewed.   Assessment/Plan Principal Problem:   Cellulitis of right lower extremity Observation/MedSurg. Continue IV fluids. Acetaminophen, oxycodone or ketorolac as needed. Continue  ceftriaxone 1 g IVPB every 24 hours.  Active Problems:   Hypothyroidism Continue levothyroxine 125 mcg p.o. daily.    Dyslipidemia Currently not on medical therapy. Continue lifestyle modifications. Should discuss long-term treatment with PCP as an outpatient.    Anemia Iron studies in April last year show iron deficiency. Continue H&H monitoring.   DVT prophylaxis: Lovenox SQ. Code Status: Full code. Family Communication: Disposition Plan: Admit for IV antibiotics for 2 to 3 days. Consults called: Admission status: Inpatient/MedSurg.   Reubin Milan MD [facility]  05/18/2018, 3:13 AM   This document was prepared using Dragon voice recognition software and may contain some unintended transcription errors.

## 2018-05-18 NOTE — Progress Notes (Signed)
Pharmacy Antibiotic Note  Derrick Walters is a 58 y.o. male admitted on 05/17/2018 with cellulitis.  Pharmacy has been consulted for Cefazolin dosing.  Plan: Cefazolin 1gm IV q8h F/U cxs and clinical progress Monitor V/S, labs  Height: 6' (182.9 cm) Weight: 188 lb (85.3 kg) IBW/kg (Calculated) : 77.6  Temp (24hrs), Avg:98.6 F (37 C), Min:97.8 F (36.6 C), Max:99.6 F (37.6 C)  Recent Labs  Lab 05/17/18 2148 05/18/18 0416  WBC 16.7* 13.8*  CREATININE 1.24 1.12    Estimated Creatinine Clearance: 77.9 mL/min (by C-G formula based on SCr of 1.12 mg/dL).    No Known Allergies  Antimicrobials this admission: Cefazolin 3/19 >>  Rocephin 3/18 x 1 dose  Dose adjustments this admission: n/a  Microbiology results: No cultures  Thank you for allowing pharmacy to be a part of this patient's care.  Isac Sarna, BS [place], [place] Clinical Pharmacist Pager [phone] 05/18/2018 11:37 AM

## 2018-05-19 DIAGNOSIS — E034 Atrophy of thyroid (acquired): Secondary | ICD-10-CM

## 2018-05-19 DIAGNOSIS — E785 Hyperlipidemia, unspecified: Secondary | ICD-10-CM

## 2018-05-19 DIAGNOSIS — D649 Anemia, unspecified: Secondary | ICD-10-CM

## 2018-05-19 LAB — CBC WITH DIFFERENTIAL/PLATELET
Abs Immature Granulocytes: 0.04 10*3/uL (ref 0.00–0.07)
Basophils Absolute: 0 10*3/uL (ref 0.0–0.1)
Basophils Relative: 0 %
Eosinophils Absolute: 0.1 10*3/uL (ref 0.0–0.5)
Eosinophils Relative: 1 %
HCT: 34.3 % — ABNORMAL LOW (ref 39.0–52.0)
Hemoglobin: 10.5 g/dL — ABNORMAL LOW (ref 13.0–17.0)
Immature Granulocytes: 0 %
Lymphocytes Relative: 10 %
Lymphs Abs: 1 10*3/uL (ref 0.7–4.0)
MCH: 26.1 pg (ref 26.0–34.0)
MCHC: 30.6 g/dL (ref 30.0–36.0)
MCV: 85.3 fL (ref 80.0–100.0)
Monocytes Absolute: 0.7 10*3/uL (ref 0.1–1.0)
Monocytes Relative: 7 %
NEUTROS ABS: 7.9 10*3/uL — AB (ref 1.7–7.7)
Neutrophils Relative %: 82 %
Platelets: 265 10*3/uL (ref 150–400)
RBC: 4.02 MIL/uL — ABNORMAL LOW (ref 4.22–5.81)
RDW: 17 % — ABNORMAL HIGH (ref 11.5–15.5)
WBC: 9.8 10*3/uL (ref 4.0–10.5)
nRBC: 0 % (ref 0.0–0.2)

## 2018-05-19 NOTE — Progress Notes (Signed)
PROGRESS NOTE    Derrick Walters  CZY:[id number] DOB: [birth year] DOA: 05/17/2018 PCP: Cassandria Anger, MD    Brief Narrative:  58 year old male admitted to the hospital with right lower extremity cellulitis.  He is being admitted for IV antibiotics.   Assessment & Plan:   Principal Problem:   Cellulitis of right lower extremity Active Problems:   Hypothyroidism   Dyslipidemia   Anemia   1. Cellulitis.  Patient is admitted with right lower extremity cellulitis.  Currently on Ancef.  Symptoms are slowly improving.  Continue current treatments.  Venous Dopplers negative for DVT. 2. Dyslipidemia.  Currently not on medical therapy.  Should discuss with primary care physician before initiating therapy. 3. Hypothyroidism.  Continue on Synthroid. 4. Anemia.  Previous work-up from last year indicated iron deficiency.  Follow-up with primary care physician.   DVT prophylaxis: Lovenox Code Status: Full code Family Communication: No family present Disposition Plan: Discharge home once cellulitis has improved   Consultants:     Procedures:     Antimicrobials:   Ancef 3/19 >   Subjective: Right lower extremity is less painful on standing.  Feels that erythema is improving.  Objective: Vitals:   05/18/18 2035 05/18/18 2045 05/19/18 0523 05/19/18 1440  BP:  135/77 122/81 113/82  Pulse:  86 76 74  Resp:  16 16 16   Temp:  98.7 F (37.1 C) 97.6 F (36.4 C) 98.7 F (37.1 C)  TempSrc:  Oral Oral Oral  SpO2: 98% 100% 98% 100%  Weight:      Height:        Intake/Output Summary (Last 24 hours) at 05/19/2018 1847 Last data filed at 05/19/2018 1254 Gross per 24 hour  Intake 770 ml  Output --  Net 770 ml   Filed Weights   05/17/18 2114  Weight: 85.3 kg    Examination:  General exam: Appears calm and comfortable  Respiratory system: Clear to auscultation. Respiratory effort normal. Cardiovascular system: S1 & S2 heard, RRR. No JVD, murmurs, rubs, gallops or  clicks. No pedal edema. Gastrointestinal system: Abdomen is nondistended, soft and nontender. No organomegaly or masses felt. Normal bowel sounds heard. Central nervous system: Alert and oriented. No focal neurological deficits. Extremities: Right lower extremity is more edematous than left Skin: Erythema persisting in right lower extremity Psychiatry: Judgement and insight appear normal. Mood & affect appropriate.     Data Reviewed: I have personally reviewed following labs and imaging studies  CBC: Recent Labs  Lab 05/17/18 2148 05/18/18 0416 05/19/18 0532  WBC 16.7* 13.8* 9.8  NEUTROABS 14.7* 11.7* 7.9*  HGB 11.2* 10.5* 10.5*  HCT 36.0* 34.8* 34.3*  MCV 85.7 86.4 85.3  PLT 292 281 093   Basic Metabolic Panel: Recent Labs  Lab 05/17/18 2148 05/18/18 0416  NA 135 137  K 3.9 3.8  CL 103 107  CO2 22 24  GLUCOSE 104* 99  BUN 21* 17  CREATININE 1.24 1.12  CALCIUM 8.7* 8.5*  MG 2.0  --    GFR: Estimated Creatinine Clearance: 77.9 mL/min (by C-G formula based on SCr of 1.12 mg/dL). Liver Function Tests: Recent Labs  Lab 05/17/18 2148  AST 21  ALT 15  ALKPHOS 84  BILITOT 0.5  PROT 8.0  ALBUMIN 3.7   No results for input(s): LIPASE, AMYLASE in the last 168 hours. No results for input(s): AMMONIA in the last 168 hours. Coagulation Profile: No results for input(s): INR, PROTIME in the last 168 hours. Cardiac Enzymes: No results for input(s):  CKTOTAL, CKMB, CKMBINDEX, TROPONINI in the last 168 hours. BNP (last 3 results) No results for input(s): PROBNP in the last 8760 hours. HbA1C: No results for input(s): HGBA1C in the last 72 hours. CBG: No results for input(s): GLUCAP in the last 168 hours. Lipid Profile: No results for input(s): CHOL, HDL, LDLCALC, TRIG, CHOLHDL, LDLDIRECT in the last 72 hours. Thyroid Function Tests: No results for input(s): TSH, T4TOTAL, FREET4, T3FREE, THYROIDAB in the last 72 hours. Anemia Panel: No results for input(s): VITAMINB12,  FOLATE, FERRITIN, TIBC, IRON, RETICCTPCT in the last 72 hours. Sepsis Labs: No results for input(s): PROCALCITON, LATICACIDVEN in the last 168 hours.  No results found for this or any previous visit (from the past 240 hour(s)).       Radiology Studies: US Venous Img Lower Unilateral Right  Result Date: 05/18/2018 CLINICAL DATA:  Right lower extremity cellulitis and edema. EXAM: RIGHT LOWER EXTREMITY VENOUS DOPPLER ULTRASOUND TECHNIQUE: Gray-scale sonography with graded compression, as well as color Doppler and duplex ultrasound were performed to evaluate the lower extremity deep venous systems from the level of the common femoral vein and including the common femoral, femoral, profunda femoral, popliteal and calf veins including the posterior tibial, peroneal and gastrocnemius veins when visible. The superficial great saphenous vein was also interrogated. Spectral Doppler was utilized to evaluate flow at rest and with distal augmentation maneuvers in the common femoral, femoral and popliteal veins. COMPARISON:  None. FINDINGS: Contralateral Common Femoral Vein: Respiratory phasicity is normal and symmetric with the symptomatic side. No evidence of thrombus. Normal compressibility. Common Femoral Vein: No evidence of thrombus. Normal compressibility, respiratory phasicity and response to augmentation. Saphenofemoral Junction: No evidence of thrombus. Normal compressibility and flow on color Doppler imaging. Profunda Femoral Vein: No evidence of thrombus. Normal compressibility and flow on color Doppler imaging. Femoral Vein: No evidence of thrombus. Normal compressibility, respiratory phasicity and response to augmentation. Popliteal Vein: No evidence of thrombus. Normal compressibility, respiratory phasicity and response to augmentation. Calf Veins: No evidence of thrombus. Normal compressibility and flow on color Doppler imaging. Superficial Great Saphenous Vein: No evidence of thrombus. Normal  compressibility. Venous Reflux:  None. Other Findings: No evidence of superficial thrombophlebitis or abnormal fluid collection. Mildly prominent but nonenlarged right inguinal lymph node measures 1.0 cm in short axis and is likely reactive. IMPRESSION: No evidence of right lower extremity deep venous thrombosis. Electronically Signed   By: Aletta Edouard M.D.   On: 05/18/2018 13:30   Dg Knee Complete 4 Views Right  Result Date: 05/17/2018 CLINICAL DATA:  Swelling and erythema EXAM: RIGHT KNEE - COMPLETE 4+ VIEW COMPARISON:  None. FINDINGS: No fracture or malalignment. Joint space calcifications. Trace knee effusion. Diffuse soft tissue swelling without emphysema IMPRESSION: 1. No acute osseous abnormality. 2. Trace knee effusion 3. Chondrocalcinosis Electronically Signed   By: Donavan Foil M.D.   On: 05/17/2018 23:22   Dg Toe Great Right  Result Date: 05/17/2018 CLINICAL DATA:  Total infection EXAM: RIGHT GREAT TOE COMPARISON:  None. FINDINGS: There is no evidence of fracture or dislocation. There is no evidence of arthropathy or other focal bone abnormality. No soft tissue emphysema. IMPRESSION: No acute osseous abnormality Electronically Signed   By: Donavan Foil M.D.   On: 05/17/2018 23:21        Scheduled Meds:  acyclovir  200 mg Oral Daily   enoxaparin (LOVENOX) injection  40 mg Subcutaneous Q24H   levothyroxine  150 mcg Oral Q0600   Continuous Infusions:  sodium chloride 100 mL/hr  at 05/18/18 2223    ceFAZolin (ANCEF) IV 1 g (05/19/18 1358)     LOS: 1 day    Time spent: 5mins    Kathie Dike, MD [facility]   If 7PM-7AM, please contact night-coverage [email or web address]  05/19/2018, 6:47 PM

## 2018-05-20 DIAGNOSIS — D509 Iron deficiency anemia, unspecified: Secondary | ICD-10-CM

## 2018-05-20 LAB — CBC WITH DIFFERENTIAL/PLATELET
ABS IMMATURE GRANULOCYTES: 0.03 10*3/uL (ref 0.00–0.07)
Basophils Absolute: 0 10*3/uL (ref 0.0–0.1)
Basophils Relative: 0 %
Eosinophils Absolute: 0.2 10*3/uL (ref 0.0–0.5)
Eosinophils Relative: 2 %
HCT: 33.6 % — ABNORMAL LOW (ref 39.0–52.0)
Hemoglobin: 10.4 g/dL — ABNORMAL LOW (ref 13.0–17.0)
Immature Granulocytes: 0 %
Lymphocytes Relative: 13 %
Lymphs Abs: 1 10*3/uL (ref 0.7–4.0)
MCH: 26.6 pg (ref 26.0–34.0)
MCHC: 31 g/dL (ref 30.0–36.0)
MCV: 85.9 fL (ref 80.0–100.0)
MONO ABS: 0.5 10*3/uL (ref 0.1–1.0)
MONOS PCT: 7 %
NEUTROS ABS: 5.9 10*3/uL (ref 1.7–7.7)
NEUTROS PCT: 78 %
Platelets: 277 10*3/uL (ref 150–400)
RBC: 3.91 MIL/uL — ABNORMAL LOW (ref 4.22–5.81)
RDW: 16.9 % — ABNORMAL HIGH (ref 11.5–15.5)
WBC: 7.7 10*3/uL (ref 4.0–10.5)
nRBC: 0 % (ref 0.0–0.2)

## 2018-05-20 NOTE — Plan of Care (Signed)
Patient is alert and oriented x 4. His vitals are stable. He denies any pain. Patient had an infiltration on the left arm that appeared to have been going for several hours. Patient's arm was hard and swollen in appearance and to touch. IV was removed and new iv was started in the right arm. Patient's iv fluid rate was decreased this morning. His cellulitis has not progressed outside of marked area. Patient has been voiding appropriately and denies pain. Will continue to monitor patient.

## 2018-05-20 NOTE — Plan of Care (Signed)
Notified Dr. Wynetta Emery about concerns of patient's left arm. A serous filled blister has formed and the arm is now pink-red in certain areas. Awaiting response.

## 2018-05-20 NOTE — Progress Notes (Signed)
PROGRESS NOTE    Derrick Walters  ZCH:[id number] DOB: 07-31-1960 DOA: 05/17/2018 PCP: Cassandria Anger, MD    Brief Narrative:  58 year old male admitted to the hospital with right lower extremity cellulitis.  He is being admitted for IV antibiotics.   Assessment & Plan:   Principal Problem:   Cellulitis of right lower extremity Active Problems:   Hypothyroidism   Dyslipidemia   Anemia   1. Cellulitis Right leg - Unfortunately IV infiltrated and now unsure how much IV antibiotic he received yesterday, per patient leg appears unchanged  New IV has been placed.  Currently on Ancef.   Venous Dopplers negative for DVT. 2. Dyslipidemia - diet control, follow up with PCP.  3. Hypothyroidism - Continue on Synthroid. 4. Iron Deficiency Anemia.  Previous work-up from last year indicated iron deficiency.  Follow-up with primary care physician. 5. Leukocytosis - resolved now.   DVT prophylaxis: Lovenox Code Status: Full code Family Communication: No family present Disposition Plan: Discharge home in 1-2 days, continue IV antibiotics    Consultants:     Procedures:     Antimicrobials:   Ancef 3/19 >   Subjective: IV infiltrated, swollen left arm. Right leg unchanged.   Objective: Vitals:   05/19/18 1440 05/19/18 2039 05/19/18 2134 05/20/18 0528  BP: 113/82  135/79 129/81  Pulse: 74  83 77  Resp: 16  16 16   Temp: 98.7 F (37.1 C)  99.6 F (37.6 C) 97.6 F (36.4 C)  TempSrc: Oral  Oral Oral  SpO2: 100% 98% 100% 99%  Weight:      Height:        Intake/Output Summary (Last 24 hours) at 05/20/2018 1049 Last data filed at 05/20/2018 0800 Gross per 24 hour  Intake 1110 ml  Output --  Net 1110 ml   Filed Weights   05/17/18 2114  Weight: 85.3 kg    Examination:  General exam: Appears calm and comfortable  Respiratory system: Clear to auscultation. Respiratory effort normal. Cardiovascular system: normal S1 & S2 heard, RRR. No JVD, murmurs, rubs, gallops or  clicks. No pedal edema. Gastrointestinal system: Abdomen is nondistended, soft and nontender. No organomegaly or masses felt. Normal bowel sounds heard. Central nervous system: Alert and oriented. No focal neurological deficits. Extremities: Right lower extremity warm, swollen, red streaks seen Skin: Erythema streaks in right lower extremity Psychiatry: Judgement and insight appear normal. Mood & affect appropriate.  Data Reviewed: I have personally reviewed following labs and imaging studies  CBC: Recent Labs  Lab 05/17/18 2148 05/18/18 0416 05/19/18 0532 05/20/18 0623  WBC 16.7* 13.8* 9.8 7.7  NEUTROABS 14.7* 11.7* 7.9* 5.9  HGB 11.2* 10.5* 10.5* 10.4*  HCT 36.0* 34.8* 34.3* 33.6*  MCV 85.7 86.4 85.3 85.9  PLT 292 281 265 287   Basic Metabolic Panel: Recent Labs  Lab 05/17/18 2148 05/18/18 0416  NA 135 137  K 3.9 3.8  CL 103 107  CO2 22 24  GLUCOSE 104* 99  BUN 21* 17  CREATININE 1.24 1.12  CALCIUM 8.7* 8.5*  MG 2.0  --    GFR: Estimated Creatinine Clearance: 77.9 mL/min (by C-G formula based on SCr of 1.12 mg/dL). Liver Function Tests: Recent Labs  Lab 05/17/18 2148  AST 21  ALT 15  ALKPHOS 84  BILITOT 0.5  PROT 8.0  ALBUMIN 3.7   No results for input(s): LIPASE, AMYLASE in the last 168 hours. No results for input(s): AMMONIA in the last 168 hours. Coagulation Profile: No results for input(s):  INR, PROTIME in the last 168 hours. Cardiac Enzymes: No results for input(s): CKTOTAL, CKMB, CKMBINDEX, TROPONINI in the last 168 hours. BNP (last 3 results) No results for input(s): PROBNP in the last 8760 hours. HbA1C: No results for input(s): HGBA1C in the last 72 hours. CBG: No results for input(s): GLUCAP in the last 168 hours. Lipid Profile: No results for input(s): CHOL, HDL, LDLCALC, TRIG, CHOLHDL, LDLDIRECT in the last 72 hours. Thyroid Function Tests: No results for input(s): TSH, T4TOTAL, FREET4, T3FREE, THYROIDAB in the last 72 hours. Anemia  Panel: No results for input(s): VITAMINB12, FOLATE, FERRITIN, TIBC, IRON, RETICCTPCT in the last 72 hours. Sepsis Labs: No results for input(s): PROCALCITON, LATICACIDVEN in the last 168 hours.  No results found for this or any previous visit (from the past 240 hour(s)).   Radiology Studies: US Venous Img Lower Unilateral Right  Result Date: 05/18/2018 CLINICAL DATA:  Right lower extremity cellulitis and edema. EXAM: RIGHT LOWER EXTREMITY VENOUS DOPPLER ULTRASOUND TECHNIQUE: Gray-scale sonography with graded compression, as well as color Doppler and duplex ultrasound were performed to evaluate the lower extremity deep venous systems from the level of the common femoral vein and including the common femoral, femoral, profunda femoral, popliteal and calf veins including the posterior tibial, peroneal and gastrocnemius veins when visible. The superficial great saphenous vein was also interrogated. Spectral Doppler was utilized to evaluate flow at rest and with distal augmentation maneuvers in the common femoral, femoral and popliteal veins. COMPARISON:  None. FINDINGS: Contralateral Common Femoral Vein: Respiratory phasicity is normal and symmetric with the symptomatic side. No evidence of thrombus. Normal compressibility. Common Femoral Vein: No evidence of thrombus. Normal compressibility, respiratory phasicity and response to augmentation. Saphenofemoral Junction: No evidence of thrombus. Normal compressibility and flow on color Doppler imaging. Profunda Femoral Vein: No evidence of thrombus. Normal compressibility and flow on color Doppler imaging. Femoral Vein: No evidence of thrombus. Normal compressibility, respiratory phasicity and response to augmentation. Popliteal Vein: No evidence of thrombus. Normal compressibility, respiratory phasicity and response to augmentation. Calf Veins: No evidence of thrombus. Normal compressibility and flow on color Doppler imaging. Superficial Great Saphenous Vein: No  evidence of thrombus. Normal compressibility. Venous Reflux:  None. Other Findings: No evidence of superficial thrombophlebitis or abnormal fluid collection. Mildly prominent but nonenlarged right inguinal lymph node measures 1.0 cm in short axis and is likely reactive. IMPRESSION: No evidence of right lower extremity deep venous thrombosis. Electronically Signed   By: Aletta Edouard M.D.   On: 05/18/2018 13:30   Scheduled Meds:  acyclovir  200 mg Oral Daily   enoxaparin (LOVENOX) injection  40 mg Subcutaneous Q24H   levothyroxine  150 mcg Oral Q0600   Continuous Infusions:  sodium chloride 100 mL/hr at 05/20/18 0943    ceFAZolin (ANCEF) IV 1 g (05/20/18 0526)     LOS: 2 days    Time spent: 27 mins  Chandani Rogowski Wynetta Emery, MD How to contact the [facility] Attending or Consulting provider [place] or covering provider during after hours [place], for this patient?  1. Check the care team in [facility] and look for a) attending/consulting [facility] provider listed and b) the [facility] team listed 2. Log into [email or web address] and use [place]'s universal password to access. If you do not have the password, please contact the hospital operator. 3. Locate the [facility] provider you are looking for under [facility] and page to a number that you can be directly reached. 4. If you still have difficulty reaching the provider,  please page the [facility] (Director on Call) for the Hospitalists listed on amion for assistance.   If 7PM-7AM, please contact night-coverage [email or web address]  05/20/2018, 10:49 AM

## 2018-05-21 MED ORDER — CEPHALEXIN 500 MG PO TABS: 500.0000 mg | ORAL_TABLET | Freq: Four times a day (QID) | ORAL | 0 refills | Status: AC

## 2018-05-21 NOTE — Progress Notes (Signed)
Nsg Discharge Note  Admit Date:  05/17/2018 Discharge date: 05/21/2018   Derrick Walters to be D/C'd Home per MD order.  AVS completed.  Copy for chart, and copy for patient signed, and dated. Patient/caregiver able to verbalize understanding.  Discharge Medication: Allergies as of 05/21/2018   No Known Allergies     Medication List    STOP taking these medications   sulfamethoxazole-trimethoprim 800-160 MG tablet Commonly known as:  BACTRIM DS,SEPTRA DS     TAKE these medications   acyclovir 200 MG capsule Commonly known as:  ZOVIRAX Take 200 mg by mouth daily.   Cephalexin 500 MG tablet Take 1 tablet (500 mg total) by mouth 4 (four) times daily for 7 days.   Cholecalciferol 25 MCG (1000 UT) tablet Take 1,000 Units by mouth daily.   FLAX SEEDS PO Take 1 tablet by mouth daily.   levothyroxine 150 MCG tablet Commonly known as:  SYNTHROID, LEVOTHROID TAKE 1 TABLET DAILY BEFORE BREAKFAST What changed:  Another medication with the same name was removed. Continue taking this medication, and follow the directions you see here.       Discharge Assessment: Vitals:   05/20/18 2113 05/21/18 0643  BP: 137/90 129/87  Pulse: 73 76  Resp: 18 17  Temp: 98.3 F (36.8 C) 98 F (36.7 C)  SpO2: 100% 100%   Skin clean, dry and intact without evidence of skin break down, no evidence of skin tears noted. IV catheter discontinued intact. Site without signs and symptoms of complications - no redness or edema noted at insertion site, patient denies c/o pain - only slight tenderness at site.  Dressing with slight pressure applied.  D/c Instructions-Education: Discharge instructions given to patient/family with verbalized understanding. D/c education completed with patient/family including follow up instructions, medication list, d/c activities limitations if indicated, with other d/c instructions as indicated by MD - patient able to verbalize understanding, all questions fully  answered. Patient instructed to return to ED, call 911, or call MD for any changes in condition.  Patient escorted via [place], and D/C home via private auto.  Loa Socks, RN 05/21/2018 12:54 PM

## 2018-05-21 NOTE — Discharge Instructions (Signed)
Cellulitis, Adult ° °Cellulitis is a skin infection. The infected area is often warm, red, swollen, and sore. It occurs most often in the arms and lower legs. It is very important to get treated for this condition. °What are the causes? °This condition is caused by bacteria. The bacteria enter through a break in the skin, such as a cut, burn, insect bite, open sore, or crack. °What increases the risk? °This condition is more likely to occur in people who: °· Have a weak body defense system (immune system). °· Have open cuts, burns, bites, or scrapes on the skin. °· Are older than 60 years of age. °· Have a blood sugar problem (diabetes). °· Have a long-lasting (chronic) liver disease (cirrhosis) or kidney disease. °· Are very overweight (obese). °· Have a skin problem, such as: °? Itchy rash (eczema). °? Slow movement of blood in the veins (venous stasis). °? Fluid buildup below the skin (edema). °· Have been treated with high-energy rays (radiation). °· Use IV drugs. °What are the signs or symptoms? °Symptoms of this condition include: °· Skin that is: °? Red. °? Streaking. °? Spotting. °? Swollen. °? Sore or painful when you touch it. °? Warm. °· A fever. °· Chills. °· Blisters. °How is this diagnosed? °This condition is diagnosed based on: °· Medical history. °· Physical exam. °· Blood tests. °· Imaging tests. °How is this treated? °Treatment for this condition may include: °· Medicines to treat infections or allergies. °· Home care, such as: °? Rest. °? Placing cold or warm cloths (compresses) on the skin. °· Hospital care, if the condition is very bad. °Follow these instructions at home: °Medicines °· Take over-the-counter and prescription medicines only as told by your doctor. °· If you were prescribed an antibiotic medicine, take it as told by your doctor. Do not stop taking it even if you start to feel better. °General instructions ° °· Drink enough fluid to keep your pee (urine) pale yellow. °· Do not touch  or rub the infected area. °· Raise (elevate) the infected area above the level of your heart while you are sitting or lying down. °· Place cold or warm cloths on the area as told by your doctor. °· Keep all follow-up visits as told by your doctor. This is important. °Contact a doctor if: °· You have a fever. °· You do not start to get better after 1-2 days of treatment. °· Your bone or joint under the infected area starts to hurt after the skin has healed. °· Your infection comes back. This can happen in the same area or another area. °· You have a swollen bump in the area. °· You have new symptoms. °· You feel ill and have muscle aches and pains. °Get help right away if: °· Your symptoms get worse. °· You feel very sleepy. °· You throw up (vomit) or have watery poop (diarrhea) for a long time. °· You see red streaks coming from the area. °· Your red area gets larger. °· Your red area turns dark in color. °These symptoms may represent a serious problem that is an emergency. Do not wait to see if the symptoms will go away. Get medical help right away. Call your local emergency services (911 in the [place] Do not drive yourself to the hospital. °Summary °· Cellulitis is a skin infection. The area is often warm, red, swollen, and sore. °· This condition is treated with medicines, rest, and cold and warm cloths. °· Take all medicines only   as told by your doctor.  Tell your doctor if symptoms do not start to get better after 1-2 days of treatment. This information is not intended to replace advice given to you by your health care provider. Make sure you discuss any questions you have with your health care provider. Document Released: 08/04/2007 Document Revised: 07/07/2017 Document Reviewed: 07/07/2017 Elsevier Interactive Patient Education  2019 Else[employer] Inc.   Peripheral Edema  Peripheral edema is swelling that is caused by a buildup of fluid. Peripheral edema most often affects the lower legs, ankles, and  feet. It can also develop in the arms, hands, and face. The area of the body that has peripheral edema will look swollen. It may also feel heavy or warm. Your clothes may start to feel tight. Pressing on the area may make a temporary dent in your skin. You may not be able to move your arm or leg as much as usual. There are many causes of peripheral edema. It can be a complication of other diseases, such as congestive heart failure, kidney disease, or a problem with your blood circulation. It also can be a side effect of certain medicines. It often happens to women during pregnancy. Sometimes, the cause is not known. Treating the underlying condition is often the only treatment for peripheral edema. Follow these instructions at home: Pay attention to any changes in your symptoms. Take these actions to help with your discomfort:  Raise (elevate) your legs while you are sitting or lying down.  Move around often to prevent stiffness and to lessen swelling. Do not sit or stand for long periods of time.  Wear support stockings as told by your health care provider.  Follow instructions from your health care provider about limiting salt (sodium) in your diet. Sometimes eating less salt can reduce swelling.  Take over-the-counter and prescription medicines only as told by your health care provider. Your health care provider may prescribe medicine to help your body get rid of excess water (diuretic).  Keep all follow-up visits as told by your health care provider. This is important. Contact a health care provider if:  You have a fever.  Your edema starts suddenly or is getting worse, especially if you are pregnant or have a medical condition.  You have swelling in only one leg.  You have increased swelling and pain in your legs. Get help right away if:  You develop shortness of breath, especially when you are lying down.  You have pain in your chest or abdomen.  You feel weak.  You  faint. This information is not intended to replace advice given to you by your health care provider. Make sure you discuss any questions you have with your health care provider. Document Released: 03/25/2004 Document Revised: 07/21/2015 Document Reviewed: 08/28/2014 Elsevier Interactive Patient Education  2019 Elsevier Inc.  IMPORTANT INFORMATION: PAY CLOSE ATTENTION   PHYSICIAN DISCHARGE INSTRUCTIONS  Follow with Primary care provider  Plotnikov, Evie Lacks, MD  and other consultants as instructed your Hospitalist Physician  [place] IF SYMPTOMS COME BACK, WORSEN OR NEW PROBLEM DEVELOPS.   Please note: You were cared for by a hospitalist during your hospital stay. Every effort will be made to forward records to your primary care provider.  You can request that your primary care provider send for your hospital records if they have not received them.  Once you are discharged, your primary care physician will handle any further medical issues. Please note that  NO REFILLS for any discharge medications will be authorized once you are discharged, as it is imperative that you return to your primary care physician (or establish a relationship with a primary care physician if you do not have one) for your post hospital discharge needs so that they can reassess your need for medications and monitor your lab values.  Please get a complete blood count and chemistry panel checked by your Primary MD at your next visit, and again as instructed by your Primary MD.  Get Medicines reviewed and adjusted: Please take all your medications with you for your next visit with your Primary MD  Laboratory/radiological data: Please request your Primary MD to go over all hospital tests and procedure/radiological results at the follow up, please ask your primary care provider to get all Hospital records sent to his/her office.  In some cases, they will be blood work, cultures and  biopsy results pending at the time of your discharge. Please request that your primary care provider follow up on these results.  If you are diabetic, please bring your blood sugar readings with you to your follow up appointment with primary care.    Please call and make your follow up appointments as soon as possible.    Also Note the following: If you experience worsening of your admission symptoms, develop shortness of breath, life threatening emergency, suicidal or homicidal thoughts you must seek medical attention immediately by calling 911 or calling your MD immediately  if symptoms less severe.  You must read complete instructions/literature along with all the possible adverse reactions/side effects for all the Medicines you take and that have been prescribed to you. Take any new Medicines after you have completely understood and accpet all the possible adverse reactions/side effects.   Do not drive when taking Pain medications or sleeping medications (Benzodiazepines)  Do not take more than prescribed Pain, Sleep and Anxiety Medications. It is not advisable to combine anxiety,sleep and pain medications without talking with your primary care practitioner  Special Instructions: If you have smoked or chewed Tobacco  in the last 2 yrs please stop smoking, stop any regular Alcohol  and or any Recreational drug use.  Wear Seat belts while driving.

## 2018-05-21 NOTE — Discharge Summary (Signed)
Physician Discharge Summary  KONNAR BEN VQM:[id number] DOB: Sep 27, 1960 DOA: 05/17/2018  PCP: Cassandria Anger, MD  Admit date: 05/17/2018 Discharge date: 05/21/2018  Admitted From:  Home  Disposition: Home   Recommendations for Outpatient Follow-up:  1. Follow up with PCP later this week as already scheduled for recheck.   Discharge Condition: STABLE   CODE STATUS: FULL    Brief Hospitalization Summary: Please see all hospital notes, images, labs for full details of the hospitalization. Dr. Lavetta Nielsen HPI: Derrick Walters is a 58 y.o. male with medical history significant of iron deficiency anemia, nephrolithiasis, hyperlipidemia, hypothyroidism who is coming to the emergency department with complaints of having a progressively worse right toe infection which has been painful and swollen.  He has been soaking it in Epsom salt water for the past few days.  Yesterday he went to an urgent care center and received Rocephin IM and was given a prescription for oral Bactrim.  However, the patient presents today with progressively worse redness and edema over his right pretibial area all the way up to the knee.  He states the skin around his knee and upper pretibial area has become very warm, tender, erythematous and edematous.  He denies fever, chills, night sweats, but feels a little fatigued.  No rhinorrhea, sore throat, cough, wheezing or hemoptysis.  Denies chest pain, palpitations, dizziness, diaphoresis, PND or orthopnea.  No abdominal pain, nausea, emesis, diarrhea, constipation, melena or hematochezia.  No dysuria, frequency or hematuria.  Denies polyuria, polydipsia, polyphagia or blurred vision.  ED Course: Initial vital signs temperature 99.6 F, pulse 91, respirations 16, blood pressure 125/72 mmHg and O2 sat 100% on room air.  The patient was given 1 g of ceftriaxone in the emergency department.  Having a progressively worse his white count was 16.7, hemoglobin 11.2 g/dL and platelets  292.  Hepatic function tests were within normal limits.  Basic metabolic profile showed glucose 104, BUN 21, creatinine 1.24 and calcium 8.7 mg/dL.  Normal calcium when corrected to albumin.  The rest of the BMP electrolytes are within normal limits.   Imaging: his knee x-ray shows no acute osseous abnormality.  There is a trace knee effusion.  There is chondrocalcinosis.  Right great toe x-ray does not show any fracture or dislocation.  Or soft tissue emphysema.  Brief Narrative:  58 year old male admitted to the hospital with right lower extremity cellulitis.  He was admitted for IV antibiotics.  Assessment & Plan:   Principal Problem:   Cellulitis of right lower extremity Active Problems:   Hypothyroidism   Dyslipidemia   Anemia  1. Cellulitis Right leg -Marked improvement while on Ancef.   Venous Dopplers negative for DVT. WBC normalized, no fever, patient ambulating in room without difficulty. Discharge home on oral cephalexin.  Pt says he has a follow up with PCP in 2 days.   2. Dyslipidemia - diet control, follow up with PCP.  3. Hypothyroidism - Continue on Synthroid. 4. Iron Deficiency Anemia.  Previous work-up from last year indicated iron deficiency.  Follow-up with primary care physician. 5. Leukocytosis - resolved now.   DVT prophylaxis: Lovenox Code Status: Full code Family Communication: No family present Disposition Plan: Discharge home   Consultants:     Procedures:     Antimicrobials:   Ancef 3/19 >  Discharge Diagnoses:  Principal Problem:   Cellulitis of right lower extremity Active Problems:   Hypothyroidism   Dyslipidemia   Anemia   Discharge Instructions: Discharge Instructions  Increase activity slowly   Complete by:  As directed      Allergies as of 05/21/2018   No Known Allergies     Medication List    STOP taking these medications   sulfamethoxazole-trimethoprim 800-160 MG tablet Commonly known as:  BACTRIM  DS,SEPTRA DS     TAKE these medications   acyclovir 200 MG capsule Commonly known as:  ZOVIRAX Take 200 mg by mouth daily.   Cephalexin 500 MG tablet Take 1 tablet (500 mg total) by mouth 4 (four) times daily for 7 days.   Cholecalciferol 25 MCG (1000 UT) tablet Take 1,000 Units by mouth daily.   FLAX SEEDS PO Take 1 tablet by mouth daily.   levothyroxine 150 MCG tablet Commonly known as:  SYNTHROID, LEVOTHROID TAKE 1 TABLET DAILY BEFORE BREAKFAST What changed:  Another medication with the same name was removed. Continue taking this medication, and follow the directions you see here.      Follow-up Information    Plotnikov, Evie Lacks, MD. Go on 05/23/2018.   Specialty:  Internal Medicine Why:  As scheduled  Contact information: [address] [place] [id number] [phone]          No Known Allergies Allergies as of 05/21/2018   No Known Allergies     Medication List    STOP taking these medications   sulfamethoxazole-trimethoprim 800-160 MG tablet Commonly known as:  BACTRIM DS,SEPTRA DS     TAKE these medications   acyclovir 200 MG capsule Commonly known as:  ZOVIRAX Take 200 mg by mouth daily.   Cephalexin 500 MG tablet Take 1 tablet (500 mg total) by mouth 4 (four) times daily for 7 days.   Cholecalciferol 25 MCG (1000 UT) tablet Take 1,000 Units by mouth daily.   FLAX SEEDS PO Take 1 tablet by mouth daily.   levothyroxine 150 MCG tablet Commonly known as:  SYNTHROID, LEVOTHROID TAKE 1 TABLET DAILY BEFORE BREAKFAST What changed:  Another medication with the same name was removed. Continue taking this medication, and follow the directions you see here.       Procedures/Studies: US Venous Img Lower Unilateral Right  Result Date: 05/18/2018 CLINICAL DATA:  Right lower extremity cellulitis and edema. EXAM: RIGHT LOWER EXTREMITY VENOUS DOPPLER ULTRASOUND TECHNIQUE: Gray-scale sonography with graded compression, as well as color Doppler and  duplex ultrasound were performed to evaluate the lower extremity deep venous systems from the level of the common femoral vein and including the common femoral, femoral, profunda femoral, popliteal and calf veins including the posterior tibial, peroneal and gastrocnemius veins when visible. The superficial great saphenous vein was also interrogated. Spectral Doppler was utilized to evaluate flow at rest and with distal augmentation maneuvers in the common femoral, femoral and popliteal veins. COMPARISON:  None. FINDINGS: Contralateral Common Femoral Vein: Respiratory phasicity is normal and symmetric with the symptomatic side. No evidence of thrombus. Normal compressibility. Common Femoral Vein: No evidence of thrombus. Normal compressibility, respiratory phasicity and response to augmentation. Saphenofemoral Junction: No evidence of thrombus. Normal compressibility and flow on color Doppler imaging. Profunda Femoral Vein: No evidence of thrombus. Normal compressibility and flow on color Doppler imaging. Femoral Vein: No evidence of thrombus. Normal compressibility, respiratory phasicity and response to augmentation. Popliteal Vein: No evidence of thrombus. Normal compressibility, respiratory phasicity and response to augmentation. Calf Veins: No evidence of thrombus. Normal compressibility and flow on color Doppler imaging. Superficial Great Saphenous Vein: No evidence of thrombus. Normal compressibility. Venous Reflux:  None. Other Findings:  No evidence of superficial thrombophlebitis or abnormal fluid collection. Mildly prominent but nonenlarged right inguinal lymph node measures 1.0 cm in short axis and is likely reactive. IMPRESSION: No evidence of right lower extremity deep venous thrombosis. Electronically Signed   By: Aletta Edouard M.D.   On: 05/18/2018 13:30   Dg Knee Complete 4 Views Right  Result Date: 05/17/2018 CLINICAL DATA:  Swelling and erythema EXAM: RIGHT KNEE - COMPLETE 4+ VIEW COMPARISON:   None. FINDINGS: No fracture or malalignment. Joint space calcifications. Trace knee effusion. Diffuse soft tissue swelling without emphysema IMPRESSION: 1. No acute osseous abnormality. 2. Trace knee effusion 3. Chondrocalcinosis Electronically Signed   By: Donavan Foil M.D.   On: 05/17/2018 23:22   Dg Toe Great Right  Result Date: 05/17/2018 CLINICAL DATA:  Total infection EXAM: RIGHT GREAT TOE COMPARISON:  None. FINDINGS: There is no evidence of fracture or dislocation. There is no evidence of arthropathy or other focal bone abnormality. No soft tissue emphysema. IMPRESSION: No acute osseous abnormality Electronically Signed   By: Donavan Foil M.D.   On: 05/17/2018 23:21      Subjective: Pt says sweilling is much better, he his ambulating in room without difficulty.  He has no fever or chills.    Discharge Exam: Vitals:   05/20/18 2113 05/21/18 0643  BP: 137/90 129/87  Pulse: 73 76  Resp: 18 17  Temp: 98.3 F (36.8 C) 98 F (36.7 C)  SpO2: 100% 100%   Vitals:   05/20/18 0528 05/20/18 1424 05/20/18 2113 05/21/18 0643  BP: 129/81 129/83 137/90 129/87  Pulse: 77 74 73 76  Resp: 16 16 18 17   Temp: 97.6 F (36.4 C)  98.3 F (36.8 C) 98 F (36.7 C)  TempSrc: Oral  Oral Oral  SpO2: 99% 100% 100% 100%  Weight:      Height:        General exam: Appears calm and comfortable  Respiratory system: Clear to auscultation. Respiratory effort normal. Cardiovascular system: normal S1 & S2 heard. No JVD, murmurs, rubs, gallops or clicks. No pedal edema. Gastrointestinal system: Abdomen is nondistended, soft and nontender. No organomegaly or masses felt. Normal bowel sounds heard. Central nervous system: Alert and oriented. No focal neurological deficits. Extremities: Right lower extremity warm, swollen, red streaks seen Skin: trace erythema and edema in right lower extremity - much improved from prior exam.  Psychiatry: Judgement and insight appear normal. Mood & affect appropriate.     The results of significant diagnostics from this hospitalization (including imaging, microbiology, ancillary and laboratory) are listed below for reference.     Microbiology: No results found for this or any previous visit (from the past 240 hour(s)).   Labs: BNP (last 3 results) No results for input(s): BNP in the last 8760 hours. Basic Metabolic Panel: Recent Labs  Lab 05/17/18 2148 05/18/18 0416  NA 135 137  K 3.9 3.8  CL 103 107  CO2 22 24  GLUCOSE 104* 99  BUN 21* 17  CREATININE 1.24 1.12  CALCIUM 8.7* 8.5*  MG 2.0  --    Liver Function Tests: Recent Labs  Lab 05/17/18 2148  AST 21  ALT 15  ALKPHOS 84  BILITOT 0.5  PROT 8.0  ALBUMIN 3.7   No results for input(s): LIPASE, AMYLASE in the last 168 hours. No results for input(s): AMMONIA in the last 168 hours. CBC: Recent Labs  Lab 05/17/18 2148 05/18/18 0416 05/19/18 0532 05/20/18 0623  WBC 16.7* 13.8* 9.8 7.7  NEUTROABS  14.7* 11.7* 7.9* 5.9  HGB 11.2* 10.5* 10.5* 10.4*  HCT 36.0* 34.8* 34.3* 33.6*  MCV 85.7 86.4 85.3 85.9  PLT 292 281 265 277   Cardiac Enzymes: No results for input(s): CKTOTAL, CKMB, CKMBINDEX, TROPONINI in the last 168 hours. BNP: Invalid input(s): POCBNP CBG: No results for input(s): GLUCAP in the last 168 hours. D-Dimer No results for input(s): DDIMER in the last 72 hours. Hgb A1c No results for input(s): HGBA1C in the last 72 hours. Lipid Profile No results for input(s): CHOL, HDL, LDLCALC, TRIG, CHOLHDL, LDLDIRECT in the last 72 hours. Thyroid function studies No results for input(s): TSH, T4TOTAL, T3FREE, THYROIDAB in the last 72 hours.  Invalid input(s): FREET3 Anemia work up No results for input(s): VITAMINB12, FOLATE, FERRITIN, TIBC, IRON, RETICCTPCT in the last 72 hours. Urinalysis    Component Value Date/Time   COLORURINE YELLOW 12/30/2017 1127   APPEARANCEUR CLEAR 12/30/2017 1127   LABSPEC 1.015 12/30/2017 1127   PHURINE 7.5 12/30/2017 1127   GLUCOSEU  NEGATIVE 12/30/2017 1127   HGBUR NEGATIVE 12/30/2017 1127   BILIRUBINUR NEGATIVE 12/30/2017 1127   KETONESUR NEGATIVE 12/30/2017 1127   UROBILINOGEN 0.2 12/30/2017 1127   NITRITE NEGATIVE 12/30/2017 1127   LEUKOCYTESUR NEGATIVE 12/30/2017 1127   Sepsis Labs Invalid input(s): PROCALCITONIN,  WBC,  LACTICIDVEN Microbiology No results found for this or any previous visit (from the past 240 hour(s)).  Time coordinating discharge: 34 minutes   SIGNED:  Irwin Brakeman, MD  [facility] 05/21/2018, 9:41 AM How to contact the [facility] Attending or Consulting provider [place] or covering provider during after hours [place], for this patient?  1. Check the care team in [facility] and look for a) attending/consulting [facility] provider listed and b) the [facility] team listed 2. Log into [email or web address] and use [place]'s universal password to access. If you do not have the password, please contact the hospital operator. 3. Locate the [facility] provider you are looking for under [facility] and page to a number that you can be directly reached. 4. If you still have difficulty reaching the provider, please page the [facility] (Director on Call) for the Hospitalists listed on amion for assistance.

## 2018-05-23 ENCOUNTER — Encounter: Payer: Self-pay | Admitting: Internal Medicine

## 2018-05-23 ENCOUNTER — Ambulatory Visit: Payer: BC Managed Care – PPO | Admitting: Internal Medicine

## 2018-05-23 ENCOUNTER — Other Ambulatory Visit: Payer: Self-pay

## 2018-05-23 VITALS — BP 138/86 | HR 72 | Temp 97.8°F | Ht 72.0 in | Wt 192.0 lb

## 2018-05-23 DIAGNOSIS — E034 Atrophy of thyroid (acquired): Secondary | ICD-10-CM

## 2018-05-23 DIAGNOSIS — D509 Iron deficiency anemia, unspecified: Secondary | ICD-10-CM

## 2018-05-23 DIAGNOSIS — L03115 Cellulitis of right lower limb: Secondary | ICD-10-CM | POA: Diagnosis not present

## 2018-05-23 DIAGNOSIS — Z111 Encounter for screening for respiratory tuberculosis: Secondary | ICD-10-CM

## 2018-05-23 NOTE — Assessment & Plan Note (Signed)
TSH 

## 2018-05-23 NOTE — Progress Notes (Signed)
Subjective:  Patient ID: Derrick Walters, male    DOB: 1961-01-18  Age: 58 y.o. MRN: [id number]  CC: No chief complaint on file.   HPI Derrick Walters presents for post-hosp f/u - RLE cellulitis - IV abx. D/c'd on Sun on PO abx. Doppler - no DVT R big toe healing wound appeared first - had pus d/c F/u hypothyroidism   Per hx:  Dr. Lavetta Nielsen TDD:UKGUR J Joyceis a 57 y.o.malewith medical history significant ofiron deficiency anemia, nephrolithiasis, hyperlipidemia, hypothyroidism who is coming to the emergency department with complaints of having a progressively worse right toe infection which has been painful and swollen. He has been soaking it in Epsom salt water for the past few days. Yesterday he went to an urgent care center and received Rocephin IM and was given a prescription for oral Bactrim. However, the patient presents today with progressively worse redness and edema over his right pretibial area all the way up to the knee. He states the skin around his kneeand upper pretibial areahas become verywarm, tender, erythematous and edematous. He denies fever, chills, night sweats, but feels a little fatigued. No rhinorrhea, sore throat, cough, wheezing or hemoptysis. Denies chest pain, palpitations, dizziness, diaphoresis, PND or orthopnea. No abdominal pain, nausea, emesis, diarrhea, constipation, melena or hematochezia. No dysuria, frequency or hematuria. Denies polyuria, polydipsia, polyphagia or blurred vision.  ED Course:Initial vital signs temperature 99.6 F, pulse 91, respirations 16, blood pressure 125/72 mmHg and O2 sat 100% on room air. The patient was given 1 g of ceftriaxone in the emergency department.  Having a progressively worse his white count was 16.7, hemoglobin 11.2 g/dL and platelets 292. Hepatic function tests were within normal limits. Basic metabolic profile showed glucose 104, BUN 21, creatinine 1.24 and calcium 8.7 mg/dL. Normal calcium when  corrected to albumin. The rest of the BMP electrolytes are within normal limits.   Imaging:his knee x-ray shows no acute osseous abnormality. There is a trace knee effusion. There is chondrocalcinosis. Right great toe x-ray does not show any fracture or dislocation. Or soft tissue emphysema.  Brief Narrative: 58 year old male admitted to the hospital with right lower extremity cellulitis. He was admitted for IV antibiotics.  Assessment & Plan:  Principal Problem: Cellulitis of right lower extremity Active Problems: Hypothyroidism Dyslipidemia Anemia  1. CellulitisRight leg -Marked improvement while on Ancef. Venous Dopplers negative for DVT. WBC normalized, no fever, patient ambulating in room without difficulty. Discharge home on oral cephalexin.  Pt says he has a follow up with PCP in 2 days.   2. Dyslipidemia- diet control, follow up with PCP. 3. Hypothyroidism-Continue on Synthroid. 4. Iron DeficiencyAnemia. Previous work-up from last year indicated iron deficiency. Follow-up with primary care physician. 5. Leukocytosis - resolved now.  DVT prophylaxis:Lovenox Code Status:Full code Family Communication:No family present Disposition Plan:Discharge home  Outpatient Medications Prior to Visit  Medication Sig Dispense Refill  . acyclovir (ZOVIRAX) 200 MG capsule Take 200 mg by mouth daily.      . Cephalexin 500 MG tablet Take 1 tablet (500 mg total) by mouth 4 (four) times daily for 7 days. 28 tablet 0  . Cholecalciferol 1000 UNITS tablet Take 1,000 Units by mouth daily.      . Flaxseed, Linseed, (FLAX SEEDS PO) Take 1 tablet by mouth daily.     Marland Kitchen levothyroxine (SYNTHROID, LEVOTHROID) 150 MCG tablet TAKE 1 TABLET DAILY BEFORE BREAKFAST 90 tablet 3   No facility-administered medications prior to visit.     ROS: Review of  Systems  Constitutional: Negative for appetite change, fatigue and unexpected weight change.  HENT: Negative for  congestion, nosebleeds, sneezing, sore throat and trouble swallowing.   Eyes: Negative for itching and visual disturbance.  Respiratory: Negative for cough.   Cardiovascular: Negative for chest pain, palpitations and leg swelling.  Gastrointestinal: Negative for abdominal distention, blood in stool, diarrhea and nausea.  Genitourinary: Negative for frequency and hematuria.  Musculoskeletal: Positive for arthralgias. Negative for back pain, gait problem, joint swelling and neck pain.  Skin: Positive for color change, rash and wound.  Neurological: Negative for dizziness, tremors, speech difficulty and weakness.  Psychiatric/Behavioral: Negative for agitation, dysphoric mood and sleep disturbance. The patient is not nervous/anxious.     Objective:  BP 138/86 (BP Location: Left Arm, Patient Position: Sitting, Cuff Size: Normal)   Pulse 72   Temp 97.8 F (36.6 C) (Oral)   Ht 6' (1.829 m)   Wt 192 lb (87.1 kg)   SpO2 98%   BMI 26.04 kg/m   BP Readings from Last 3 Encounters:  05/23/18 138/86  05/21/18 129/87  01/06/18 132/82    Wt Readings from Last 3 Encounters:  05/23/18 192 lb (87.1 kg)  05/17/18 188 lb (85.3 kg)  01/06/18 193 lb (87.5 kg)    Physical Exam Constitutional:      General: He is not in acute distress.    Appearance: He is well-developed.     Comments: NAD  Eyes:     Conjunctiva/sclera: Conjunctivae normal.     Pupils: Pupils are equal, round, and reactive to light.  Neck:     Musculoskeletal: Normal range of motion.     Thyroid: No thyromegaly.     Vascular: No JVD.  Cardiovascular:     Rate and Rhythm: Normal rate and regular rhythm.     Heart sounds: Normal heart sounds. No murmur. No friction rub. No gallop.   Pulmonary:     Effort: Pulmonary effort is normal. No respiratory distress.     Breath sounds: Normal breath sounds. No wheezing or rales.  Chest:     Chest wall: No tenderness.  Abdominal:     General: Bowel sounds are normal. There is no  distension.     Palpations: Abdomen is soft. There is no mass.     Tenderness: There is no abdominal tenderness. There is no guarding or rebound.  Musculoskeletal: Normal range of motion.        General: No tenderness.  Lymphadenopathy:     Cervical: No cervical adenopathy.  Skin:    General: Skin is warm and dry.     Findings: No rash.  Neurological:     Mental Status: He is alert and oriented to person, place, and time.     Cranial Nerves: No cranial nerve deficit.     Motor: No abnormal muscle tone.     Coordination: Coordination normal.     Gait: Gait normal.     Deep Tendon Reflexes: Reflexes are normal and symmetric.  Psychiatric:        Behavior: Behavior normal.        Thought Content: Thought content normal.        Judgment: Judgment normal.   R knee w/swelling and redness anteriorly Residual color change - lower leg R big toe healing wound   Lab Results  Component Value Date   WBC 7.7 05/20/2018   HGB 10.4 (L) 05/20/2018   HCT 33.6 (L) 05/20/2018   PLT 277 05/20/2018   GLUCOSE 99 05/18/2018  CHOL 133 12/30/2017   TRIG 78.0 12/30/2017   HDL 32.00 (L) 12/30/2017   LDLCALC 85 12/30/2017   ALT 15 05/17/2018   AST 21 05/17/2018   NA 137 05/18/2018   K 3.8 05/18/2018   CL 107 05/18/2018   CREATININE 1.12 05/18/2018   BUN 17 05/18/2018   CO2 24 05/18/2018   TSH 0.05 (L) 12/30/2017   PSA 1.73 12/30/2017    US Venous Img Lower Unilateral Right  Result Date: 05/18/2018 CLINICAL DATA:  Right lower extremity cellulitis and edema. EXAM: RIGHT LOWER EXTREMITY VENOUS DOPPLER ULTRASOUND TECHNIQUE: Gray-scale sonography with graded compression, as well as color Doppler and duplex ultrasound were performed to evaluate the lower extremity deep venous systems from the level of the common femoral vein and including the common femoral, femoral, profunda femoral, popliteal and calf veins including the posterior tibial, peroneal and gastrocnemius veins when visible. The  superficial great saphenous vein was also interrogated. Spectral Doppler was utilized to evaluate flow at rest and with distal augmentation maneuvers in the common femoral, femoral and popliteal veins. COMPARISON:  None. FINDINGS: Contralateral Common Femoral Vein: Respiratory phasicity is normal and symmetric with the symptomatic side. No evidence of thrombus. Normal compressibility. Common Femoral Vein: No evidence of thrombus. Normal compressibility, respiratory phasicity and response to augmentation. Saphenofemoral Junction: No evidence of thrombus. Normal compressibility and flow on color Doppler imaging. Profunda Femoral Vein: No evidence of thrombus. Normal compressibility and flow on color Doppler imaging. Femoral Vein: No evidence of thrombus. Normal compressibility, respiratory phasicity and response to augmentation. Popliteal Vein: No evidence of thrombus. Normal compressibility, respiratory phasicity and response to augmentation. Calf Veins: No evidence of thrombus. Normal compressibility and flow on color Doppler imaging. Superficial Great Saphenous Vein: No evidence of thrombus. Normal compressibility. Venous Reflux:  None. Other Findings: No evidence of superficial thrombophlebitis or abnormal fluid collection. Mildly prominent but nonenlarged right inguinal lymph node measures 1.0 cm in short axis and is likely reactive. IMPRESSION: No evidence of right lower extremity deep venous thrombosis. Electronically Signed   By: Aletta Edouard M.D.   On: 05/18/2018 13:30   Dg Knee Complete 4 Views Right  Result Date: 05/17/2018 CLINICAL DATA:  Swelling and erythema EXAM: RIGHT KNEE - COMPLETE 4+ VIEW COMPARISON:  None. FINDINGS: No fracture or malalignment. Joint space calcifications. Trace knee effusion. Diffuse soft tissue swelling without emphysema IMPRESSION: 1. No acute osseous abnormality. 2. Trace knee effusion 3. Chondrocalcinosis Electronically Signed   By: Donavan Foil M.D.   On: 05/17/2018  23:22   Dg Toe Great Right  Result Date: 05/17/2018 CLINICAL DATA:  Total infection EXAM: RIGHT GREAT TOE COMPARISON:  None. FINDINGS: There is no evidence of fracture or dislocation. There is no evidence of arthropathy or other focal bone abnormality. No soft tissue emphysema. IMPRESSION: No acute osseous abnormality Electronically Signed   By: Donavan Foil M.D.   On: 05/17/2018 23:21               Assessment & Plan:   There are no diagnoses linked to this encounter.   No orders of the defined types were placed in this encounter.    Follow-up: No follow-ups on file.  Walker Kehr, MD

## 2018-05-23 NOTE — Assessment & Plan Note (Signed)
CBC, iron 

## 2018-05-23 NOTE — Assessment & Plan Note (Signed)
Finish po abx

## 2018-05-25 LAB — TB SKIN TEST
Induration: 0 mm
TB Skin Test: NEGATIVE

## 2018-06-14 MED ORDER — LEVOTHYROXINE SODIUM 150 MCG PO TABS: 150.0000 ug | ORAL_TABLET | Freq: Every day | ORAL | 3 refills | Status: DC

## 2018-07-05 ENCOUNTER — Ambulatory Visit (INDEPENDENT_AMBULATORY_CARE_PROVIDER_SITE_OTHER): Payer: BC Managed Care – PPO | Admitting: Internal Medicine

## 2018-07-05 ENCOUNTER — Encounter: Payer: Self-pay | Admitting: Internal Medicine

## 2018-07-05 DIAGNOSIS — E034 Atrophy of thyroid (acquired): Secondary | ICD-10-CM

## 2018-07-05 DIAGNOSIS — L03115 Cellulitis of right lower limb: Secondary | ICD-10-CM | POA: Diagnosis not present

## 2018-07-05 NOTE — Progress Notes (Signed)
Virtual Visit via Video Note  I connected with Derrick Walters on 07/05/18 at  3:40 PM EDT by a video enabled telemedicine application and verified that I am speaking with the correct person using two identifiers.   I discussed the limitations of evaluation and management by telemedicine and the availability of in person appointments. The patient expressed understanding and agreed to proceed.  History of Present Illness: We need to follow-up on right leg cellulitis/pain.  Follow-up on hypothyroidism  There has been no runny nose, cough, chest pain, shortness of breath, abdominal pain, diarrhea, constipation, arthralgias, skin rashes.   Observations/Objective: The patient appears to be in no acute distress, looks well.  Assessment and Plan: COVID-19 related issues discussed See my Assessment and Plan. Follow Up Instructions:    I discussed the assessment and treatment plan with the patient. The patient was provided an opportunity to ask questions and all were answered. The patient agreed with the plan and demonstrated an understanding of the instructions.   The patient was advised to call back or seek an in-person evaluation if the symptoms worsen or if the condition fails to improve as anticipated.  I provided face-to-face time during this encounter. We were at different locations.   Walker Kehr, MD

## 2018-07-05 NOTE — Assessment & Plan Note (Signed)
Right leg is 99% back to normal.  Resume all activities

## 2018-07-05 NOTE — Assessment & Plan Note (Signed)
Levothyroxine 150 mcg daily

## 2018-12-31 DIAGNOSIS — Z0000 Encounter for general adult medical examination without abnormal findings: Secondary | ICD-10-CM

## 2019-01-04 ENCOUNTER — Encounter: Payer: BC Managed Care – PPO | Admitting: Internal Medicine

## 2019-01-05 ENCOUNTER — Other Ambulatory Visit (INDEPENDENT_AMBULATORY_CARE_PROVIDER_SITE_OTHER): Payer: BC Managed Care – PPO

## 2019-01-05 DIAGNOSIS — Z0000 Encounter for general adult medical examination without abnormal findings: Secondary | ICD-10-CM

## 2019-01-05 DIAGNOSIS — Z125 Encounter for screening for malignant neoplasm of prostate: Secondary | ICD-10-CM

## 2019-01-05 DIAGNOSIS — L03115 Cellulitis of right lower limb: Secondary | ICD-10-CM | POA: Diagnosis not present

## 2019-01-05 DIAGNOSIS — D509 Iron deficiency anemia, unspecified: Secondary | ICD-10-CM

## 2019-01-05 DIAGNOSIS — E034 Atrophy of thyroid (acquired): Secondary | ICD-10-CM | POA: Diagnosis not present

## 2019-01-05 LAB — CBC WITH DIFFERENTIAL/PLATELET
Basophils Absolute: 0 10*3/uL (ref 0.0–0.1)
Basophils Relative: 0.8 % (ref 0.0–3.0)
Eosinophils Absolute: 0.1 10*3/uL (ref 0.0–0.7)
Eosinophils Relative: 1.6 % (ref 0.0–5.0)
HCT: 44.7 % (ref 39.0–52.0)
Hemoglobin: 15.1 g/dL (ref 13.0–17.0)
Lymphocytes Relative: 24.6 % (ref 12.0–46.0)
Lymphs Abs: 1.4 10*3/uL (ref 0.7–4.0)
MCHC: 33.8 g/dL (ref 30.0–36.0)
MCV: 95.1 fl (ref 78.0–100.0)
Monocytes Absolute: 0.3 10*3/uL (ref 0.1–1.0)
Monocytes Relative: 5.3 % (ref 3.0–12.0)
Neutro Abs: 3.8 10*3/uL (ref 1.4–7.7)
Neutrophils Relative %: 67.7 % (ref 43.0–77.0)
Platelets: 222 10*3/uL (ref 150.0–400.0)
RBC: 4.7 Mil/uL (ref 4.22–5.81)
RDW: 12.7 % (ref 11.5–15.5)
WBC: 5.7 10*3/uL (ref 4.0–10.5)

## 2019-01-05 LAB — URINALYSIS, ROUTINE W REFLEX MICROSCOPIC
Bilirubin Urine: NEGATIVE
Hgb urine dipstick: NEGATIVE
Ketones, ur: NEGATIVE
Leukocytes,Ua: NEGATIVE
Nitrite: NEGATIVE
Specific Gravity, Urine: 1.015 (ref 1.000–1.030)
Total Protein, Urine: NEGATIVE
Urine Glucose: NEGATIVE
Urobilinogen, UA: 0.2 (ref 0.0–1.0)
pH: 6 (ref 5.0–8.0)

## 2019-01-05 LAB — LIPID PANEL
Cholesterol: 159 mg/dL (ref 0–200)
HDL: 41.2 mg/dL (ref >39.00)
LDL Cholesterol: 108 mg/dL — ABNORMAL HIGH (ref 0–99)
NonHDL: 118.04
Total CHOL/HDL Ratio: 4
Triglycerides: 52 mg/dL (ref 0.0–149.0)
VLDL: 10.4 mg/dL (ref 0.0–40.0)

## 2019-01-05 LAB — BASIC METABOLIC PANEL
BUN: 18 mg/dL (ref 6–23)
CO2: 29 mEq/L (ref 19–32)
Calcium: 9.6 mg/dL (ref 8.4–10.5)
Chloride: 103 mEq/L (ref 96–112)
Creatinine, Ser: 1.03 mg/dL (ref 0.40–1.50)
GFR: 73.66 mL/min (ref >60.00)
Glucose, Bld: 99 mg/dL (ref 70–99)
Potassium: 4 mEq/L (ref 3.5–5.1)
Sodium: 140 mEq/L (ref 135–145)

## 2019-01-05 LAB — HEPATIC FUNCTION PANEL
ALT: 17 U/L (ref 0–53)
AST: 18 U/L (ref 0–37)
Albumin: 4.6 g/dL (ref 3.5–5.2)
Alkaline Phosphatase: 76 U/L (ref 39–117)
Bilirubin, Direct: 0.2 mg/dL (ref 0.0–0.3)
Total Bilirubin: 0.8 mg/dL (ref 0.2–1.2)
Total Protein: 7.3 g/dL (ref 6.0–8.3)

## 2019-01-05 LAB — TSH: TSH: 1.08 u[IU]/mL (ref 0.35–4.50)

## 2019-01-05 LAB — PSA: PSA: 1.58 ng/mL (ref 0.10–4.00)

## 2019-01-06 LAB — IRON,TIBC AND FERRITIN PANEL
%SAT: 48 % (calc) (ref 20–48)
Ferritin: 66 ng/mL (ref 24–380)
Iron: 163 ug/dL (ref 50–180)
TIBC: 340 mcg/dL (calc) (ref 250–425)

## 2019-01-10 ENCOUNTER — Ambulatory Visit (INDEPENDENT_AMBULATORY_CARE_PROVIDER_SITE_OTHER): Payer: BC Managed Care – PPO | Admitting: Internal Medicine

## 2019-01-10 ENCOUNTER — Other Ambulatory Visit: Payer: Self-pay

## 2019-01-10 ENCOUNTER — Encounter: Payer: Self-pay | Admitting: Internal Medicine

## 2019-01-10 VITALS — BP 128/84 | HR 69 | Temp 98.2°F | Ht 72.0 in | Wt 179.0 lb

## 2019-01-10 DIAGNOSIS — E785 Hyperlipidemia, unspecified: Secondary | ICD-10-CM

## 2019-01-10 DIAGNOSIS — E034 Atrophy of thyroid (acquired): Secondary | ICD-10-CM

## 2019-01-10 DIAGNOSIS — Z23 Encounter for immunization: Secondary | ICD-10-CM

## 2019-01-10 DIAGNOSIS — Z0000 Encounter for general adult medical examination without abnormal findings: Secondary | ICD-10-CM | POA: Diagnosis not present

## 2019-01-10 DIAGNOSIS — R972 Elevated prostate specific antigen [PSA]: Secondary | ICD-10-CM

## 2019-01-10 NOTE — Assessment & Plan Note (Signed)
Levothyroxine 150 mcg daily 

## 2019-01-10 NOTE — Assessment & Plan Note (Signed)
PSA

## 2019-01-10 NOTE — Assessment & Plan Note (Signed)
We discussed age appropriate health related issues, including available/recomended screening tests and vaccinations. We discussed a need for adhering to healthy diet and exercise. Labs/EKG were reviewed/ordered. All questions were answered. Colon due 2021 Shingrix discussed

## 2019-01-10 NOTE — Progress Notes (Signed)
Subjective:  Patient ID: Derrick Walters, male    DOB: 02-24-1961  Age: 58 y.o. MRN: [id number]  CC: No chief complaint on file.   HPI [name] presents for a well exam  Outpatient Medications Prior to Visit  Medication Sig Dispense Refill   acyclovir (ZOVIRAX) 200 MG capsule Take 200 mg by mouth daily.       Cholecalciferol 1000 UNITS tablet Take 1,000 Units by mouth daily.       Flaxseed, Linseed, (FLAX SEEDS PO) Take 1 tablet by mouth daily.      levothyroxine (SYNTHROID, LEVOTHROID) 150 MCG tablet Take 1 tablet (150 mcg total) by mouth daily before breakfast. 90 tablet 3   No facility-administered medications prior to visit.     ROS: Review of Systems  Constitutional: Negative for appetite change, fatigue and unexpected weight change.  HENT: Negative for congestion, nosebleeds, sneezing, sore throat and trouble swallowing.   Eyes: Negative for itching and visual disturbance.  Respiratory: Negative for cough.   Cardiovascular: Negative for chest pain, palpitations and leg swelling.  Gastrointestinal: Negative for abdominal distention, blood in stool, diarrhea and nausea.  Genitourinary: Negative for frequency and hematuria.  Musculoskeletal: Negative for back pain, gait problem, joint swelling and neck pain.  Skin: Negative for rash.  Neurological: Negative for dizziness, tremors, speech difficulty and weakness.  Psychiatric/Behavioral: Negative for agitation, dysphoric mood and sleep disturbance. The patient is not nervous/anxious.     Objective:  BP 128/84 (BP Location: Left Arm, Patient Position: Sitting, Cuff Size: Normal)    Pulse 69    Temp 98.2 F (36.8 C) (Oral)    Ht 6' (1.829 m)    Wt 179 lb (81.2 kg)    SpO2 98%    BMI 24.28 kg/m   BP Readings from Last 3 Encounters:  01/10/19 128/84  05/23/18 138/86  05/21/18 129/87    Wt Readings from Last 3 Encounters:  01/10/19 179 lb (81.2 kg)  05/23/18 192 lb (87.1 kg)  05/17/18 188 lb (85.3 kg)     Physical Exam Constitutional:      General: He is not in acute distress.    Appearance: He is well-developed.     Comments: NAD  Eyes:     Conjunctiva/sclera: Conjunctivae normal.     Pupils: Pupils are equal, round, and reactive to light.  Neck:     Musculoskeletal: Normal range of motion.     Thyroid: No thyromegaly.     Vascular: No JVD.  Cardiovascular:     Rate and Rhythm: Normal rate and regular rhythm.     Heart sounds: Normal heart sounds. No murmur. No friction rub. No gallop.   Pulmonary:     Effort: Pulmonary effort is normal. No respiratory distress.     Breath sounds: Normal breath sounds. No wheezing or rales.  Chest:     Chest wall: No tenderness.  Abdominal:     General: Bowel sounds are normal. There is no distension.     Palpations: Abdomen is soft. There is no mass.     Tenderness: There is no abdominal tenderness. There is no guarding or rebound.  Genitourinary:    Prostate: Normal.     Rectum: Normal. Guaiac result negative.  Musculoskeletal: Normal range of motion.        General: No tenderness.  Lymphadenopathy:     Cervical: No cervical adenopathy.  Skin:    General: Skin is warm and dry.     Findings: No rash.  Neurological:  Mental Status: He is alert and oriented to person, place, and time.     Cranial Nerves: No cranial nerve deficit.     Motor: No abnormal muscle tone.     Coordination: Coordination normal.     Gait: Gait normal.     Deep Tendon Reflexes: Reflexes are normal and symmetric.  Psychiatric:        Behavior: Behavior normal.        Thought Content: Thought content normal.        Judgment: Judgment normal.     Lab Results  Component Value Date   WBC 5.7 01/05/2019   HGB 15.1 01/05/2019   HCT 44.7 01/05/2019   PLT 222.0 01/05/2019   GLUCOSE 99 01/05/2019   CHOL 159 01/05/2019   TRIG 52.0 01/05/2019   HDL 41.20 01/05/2019   LDLCALC 108 (H) 01/05/2019   ALT 17 01/05/2019   AST 18 01/05/2019   NA 140 01/05/2019    K 4.0 01/05/2019   CL 103 01/05/2019   CREATININE 1.03 01/05/2019   BUN 18 01/05/2019   CO2 29 01/05/2019   TSH 1.08 01/05/2019   PSA 1.58 01/05/2019    US Venous Img Lower Unilateral Right  Result Date: 05/18/2018 CLINICAL DATA:  Right lower extremity cellulitis and edema. EXAM: RIGHT LOWER EXTREMITY VENOUS DOPPLER ULTRASOUND TECHNIQUE: Gray-scale sonography with graded compression, as well as color Doppler and duplex ultrasound were performed to evaluate the lower extremity deep venous systems from the level of the common femoral vein and including the common femoral, femoral, profunda femoral, popliteal and calf veins including the posterior tibial, peroneal and gastrocnemius veins when visible. The superficial great saphenous vein was also interrogated. Spectral Doppler was utilized to evaluate flow at rest and with distal augmentation maneuvers in the common femoral, femoral and popliteal veins. COMPARISON:  None. FINDINGS: Contralateral Common Femoral Vein: Respiratory phasicity is normal and symmetric with the symptomatic side. No evidence of thrombus. Normal compressibility. Common Femoral Vein: No evidence of thrombus. Normal compressibility, respiratory phasicity and response to augmentation. Saphenofemoral Junction: No evidence of thrombus. Normal compressibility and flow on color Doppler imaging. Profunda Femoral Vein: No evidence of thrombus. Normal compressibility and flow on color Doppler imaging. Femoral Vein: No evidence of thrombus. Normal compressibility, respiratory phasicity and response to augmentation. Popliteal Vein: No evidence of thrombus. Normal compressibility, respiratory phasicity and response to augmentation. Calf Veins: No evidence of thrombus. Normal compressibility and flow on color Doppler imaging. Superficial Great Saphenous Vein: No evidence of thrombus. Normal compressibility. Venous Reflux:  None. Other Findings: No evidence of superficial thrombophlebitis or  abnormal fluid collection. Mildly prominent but nonenlarged right inguinal lymph node measures 1.0 cm in short axis and is likely reactive. IMPRESSION: No evidence of right lower extremity deep venous thrombosis. Electronically Signed   By: Aletta Edouard M.D.   On: 05/18/2018 13:30   Dg Knee Complete 4 Views Right  Result Date: 05/17/2018 CLINICAL DATA:  Swelling and erythema EXAM: RIGHT KNEE - COMPLETE 4+ VIEW COMPARISON:  None. FINDINGS: No fracture or malalignment. Joint space calcifications. Trace knee effusion. Diffuse soft tissue swelling without emphysema IMPRESSION: 1. No acute osseous abnormality. 2. Trace knee effusion 3. Chondrocalcinosis Electronically Signed   By: Donavan Foil M.D.   On: 05/17/2018 23:22   Dg Toe Great Right  Result Date: 05/17/2018 CLINICAL DATA:  Total infection EXAM: RIGHT GREAT TOE COMPARISON:  None. FINDINGS: There is no evidence of fracture or dislocation. There is no evidence of arthropathy or other focal  bone abnormality. No soft tissue emphysema. IMPRESSION: No acute osseous abnormality Electronically Signed   By: Donavan Foil M.D.   On: 05/17/2018 23:21    Assessment & Plan:   There are no diagnoses linked to this encounter.   No orders of the defined types were placed in this encounter.    Follow-up: No follow-ups on file.  Walker Kehr, MD

## 2019-01-10 NOTE — Patient Instructions (Signed)

## 2019-02-07 ENCOUNTER — Other Ambulatory Visit: Payer: Self-pay

## 2019-02-07 ENCOUNTER — Ambulatory Visit (INDEPENDENT_AMBULATORY_CARE_PROVIDER_SITE_OTHER)
Admission: RE | Admit: 2019-02-07 | Discharge: 2019-02-07 | Disposition: A | Discharge status: Home / Self Care | Payer: BC Managed Care – PPO | Source: Ambulatory Visit | Attending: Internal Medicine | Admitting: Internal Medicine

## 2019-02-07 DIAGNOSIS — E785 Hyperlipidemia, unspecified: Secondary | ICD-10-CM

## 2019-02-12 ENCOUNTER — Encounter: Payer: Self-pay | Admitting: Internal Medicine

## 2019-02-12 DIAGNOSIS — I2510 Atherosclerotic heart disease of native coronary artery without angina pectoris: Secondary | ICD-10-CM | POA: Insufficient documentation

## 2019-03-14 ENCOUNTER — Ambulatory Visit (INDEPENDENT_AMBULATORY_CARE_PROVIDER_SITE_OTHER): Payer: BC Managed Care – PPO

## 2019-03-14 ENCOUNTER — Other Ambulatory Visit: Payer: Self-pay

## 2019-03-14 DIAGNOSIS — Z23 Encounter for immunization: Secondary | ICD-10-CM | POA: Diagnosis not present

## 2019-03-14 DIAGNOSIS — Z299 Encounter for prophylactic measures, unspecified: Secondary | ICD-10-CM

## 2019-05-19 ENCOUNTER — Other Ambulatory Visit: Payer: Self-pay | Admitting: Internal Medicine

## 2020-01-03 ENCOUNTER — Other Ambulatory Visit: Payer: Self-pay | Admitting: Internal Medicine

## 2020-01-03 DIAGNOSIS — Z0000 Encounter for general adult medical examination without abnormal findings: Secondary | ICD-10-CM

## 2020-01-03 DIAGNOSIS — E785 Hyperlipidemia, unspecified: Secondary | ICD-10-CM

## 2020-01-03 DIAGNOSIS — E034 Atrophy of thyroid (acquired): Secondary | ICD-10-CM

## 2020-01-08 ENCOUNTER — Other Ambulatory Visit (INDEPENDENT_AMBULATORY_CARE_PROVIDER_SITE_OTHER): Payer: BC Managed Care – PPO

## 2020-01-08 DIAGNOSIS — Z125 Encounter for screening for malignant neoplasm of prostate: Secondary | ICD-10-CM

## 2020-01-08 DIAGNOSIS — E034 Atrophy of thyroid (acquired): Secondary | ICD-10-CM

## 2020-01-08 DIAGNOSIS — Z0000 Encounter for general adult medical examination without abnormal findings: Secondary | ICD-10-CM | POA: Diagnosis not present

## 2020-01-08 LAB — URINALYSIS
Bilirubin Urine: NEGATIVE
Hgb urine dipstick: NEGATIVE
Ketones, ur: NEGATIVE
Leukocytes,Ua: NEGATIVE
Nitrite: NEGATIVE
Specific Gravity, Urine: 1.025 (ref 1.000–1.030)
Total Protein, Urine: NEGATIVE
Urine Glucose: NEGATIVE
Urobilinogen, UA: 0.2 (ref 0.0–1.0)
pH: 6 (ref 5.0–8.0)

## 2020-01-08 LAB — CBC WITH DIFFERENTIAL/PLATELET
Basophils Absolute: 0 10*3/uL (ref 0.0–0.1)
Basophils Relative: 1.1 % (ref 0.0–3.0)
Eosinophils Absolute: 0.1 10*3/uL (ref 0.0–0.7)
Eosinophils Relative: 3.4 % (ref 0.0–5.0)
HCT: 41.8 % (ref 39.0–52.0)
Hemoglobin: 14.2 g/dL (ref 13.0–17.0)
Lymphocytes Relative: 30.8 % (ref 12.0–46.0)
Lymphs Abs: 1.3 10*3/uL (ref 0.7–4.0)
MCHC: 33.9 g/dL (ref 30.0–36.0)
MCV: 92 fl (ref 78.0–100.0)
Monocytes Absolute: 0.3 10*3/uL (ref 0.1–1.0)
Monocytes Relative: 7.5 % (ref 3.0–12.0)
Neutro Abs: 2.4 10*3/uL (ref 1.4–7.7)
Neutrophils Relative %: 57.2 % (ref 43.0–77.0)
Platelets: 238 10*3/uL (ref 150.0–400.0)
RBC: 4.55 Mil/uL (ref 4.22–5.81)
RDW: 13.4 % (ref 11.5–15.5)
WBC: 4.2 10*3/uL (ref 4.0–10.5)

## 2020-01-08 LAB — COMPREHENSIVE METABOLIC PANEL
ALT: 10 U/L (ref 0–53)
AST: 15 U/L (ref 0–37)
Albumin: 4.4 g/dL (ref 3.5–5.2)
Alkaline Phosphatase: 75 U/L (ref 39–117)
BUN: 16 mg/dL (ref 6–23)
CO2: 28 mEq/L (ref 19–32)
Calcium: 9.2 mg/dL (ref 8.4–10.5)
Chloride: 106 mEq/L (ref 96–112)
Creatinine, Ser: 1.07 mg/dL (ref 0.40–1.50)
GFR: 75.14 mL/min (ref >60.00)
Glucose, Bld: 99 mg/dL (ref 70–99)
Potassium: 4.1 mEq/L (ref 3.5–5.1)
Sodium: 141 mEq/L (ref 135–145)
Total Bilirubin: 0.6 mg/dL (ref 0.2–1.2)
Total Protein: 7 g/dL (ref 6.0–8.3)

## 2020-01-08 LAB — LIPID PANEL
Cholesterol: 157 mg/dL (ref 0–200)
HDL: 40.5 mg/dL (ref >39.00)
LDL Cholesterol: 105 mg/dL — ABNORMAL HIGH (ref 0–99)
NonHDL: 116.87
Total CHOL/HDL Ratio: 4
Triglycerides: 59 mg/dL (ref 0.0–149.0)
VLDL: 11.8 mg/dL (ref 0.0–40.0)

## 2020-01-08 LAB — PSA: PSA: 1.66 ng/mL (ref 0.10–4.00)

## 2020-01-08 LAB — TSH: TSH: 0.46 u[IU]/mL (ref 0.35–4.50)

## 2020-01-08 LAB — T4, FREE: Free T4: 1.4 ng/dL (ref 0.60–1.60)

## 2020-01-14 ENCOUNTER — Other Ambulatory Visit: Payer: Self-pay

## 2020-01-14 ENCOUNTER — Ambulatory Visit (INDEPENDENT_AMBULATORY_CARE_PROVIDER_SITE_OTHER): Payer: BC Managed Care – PPO | Admitting: Internal Medicine

## 2020-01-14 ENCOUNTER — Encounter: Payer: Self-pay | Admitting: Internal Medicine

## 2020-01-14 VITALS — BP 118/84 | HR 73 | Temp 98.6°F | Ht 72.0 in | Wt 194.8 lb

## 2020-01-14 DIAGNOSIS — Z1211 Encounter for screening for malignant neoplasm of colon: Secondary | ICD-10-CM

## 2020-01-14 DIAGNOSIS — Z0000 Encounter for general adult medical examination without abnormal findings: Secondary | ICD-10-CM

## 2020-01-14 DIAGNOSIS — E034 Atrophy of thyroid (acquired): Secondary | ICD-10-CM

## 2020-01-14 DIAGNOSIS — R972 Elevated prostate specific antigen [PSA]: Secondary | ICD-10-CM

## 2020-01-14 DIAGNOSIS — E785 Hyperlipidemia, unspecified: Secondary | ICD-10-CM

## 2020-01-14 DIAGNOSIS — I2510 Atherosclerotic heart disease of native coronary artery without angina pectoris: Secondary | ICD-10-CM

## 2020-01-14 NOTE — Assessment & Plan Note (Signed)
PSA

## 2020-01-14 NOTE — Assessment & Plan Note (Signed)
  We discussed age appropriate health related issues, including available/recomended screening tests and vaccinations. Labs were ordered to be later reviewed . All questions were answered. We discussed one or more of the following - seat belt use, use of sunscreen/sun exposure exercise, safe sex, fall risk reduction, second hand smoke exposure, firearm use and storage, seat belt use, a need for adhering to healthy diet and exercise. Labs were ordered.  All questions were answered. Colon due 2021

## 2020-01-14 NOTE — Progress Notes (Signed)
Subjective:  Patient ID: Derrick Walters, male    DOB: [birth year]  Age: 59 y.o. MRN: [id number]  CC: Annual Exam  Derrick Walters presents for a well exam    Outpatient Medications Prior to Visit  Medication Sig Dispense Refill   acyclovir (ZOVIRAX) 200 MG capsule Take 200 mg by mouth daily.       Cholecalciferol 1000 UNITS tablet Take 1,000 Units by mouth daily.       Flaxseed, Linseed, (FLAX SEEDS PO) Take 1 tablet by mouth daily.      SYNTHROID 150 MCG tablet TAKE 1 TABLET DAILY BEFORE BREAKFAST 90 tablet 3   No facility-administered medications prior to visit.    ROS: Review of Systems  Constitutional: Negative for appetite change, fatigue and unexpected weight change.  HENT: Negative for congestion, nosebleeds, sneezing, sore throat and trouble swallowing.   Eyes: Negative for itching and visual disturbance.  Respiratory: Negative for cough.   Cardiovascular: Negative for chest pain, palpitations and leg swelling.  Gastrointestinal: Negative for abdominal distention, blood in stool, diarrhea and nausea.  Genitourinary: Negative for frequency and hematuria.  Musculoskeletal: Negative for back pain, gait problem, joint swelling and neck pain.  Skin: Negative for rash.  Neurological: Negative for dizziness, tremors, speech difficulty and weakness.  Psychiatric/Behavioral: Negative for agitation, dysphoric mood, sleep disturbance and suicidal ideas. The patient is not nervous/anxious.     Objective:  BP 118/84 (BP Location: Left Arm)    Pulse 73    Temp 98.6 F (37 C) (Oral)    Ht 6' (1.829 m)    Wt 194 lb 12.8 oz (88.4 kg)    SpO2 96%    BMI 26.42 kg/m   BP Readings from Last 3 Encounters:  01/14/20 118/84  01/10/19 128/84  05/23/18 138/86    Wt Readings from Last 3 Encounters:  01/14/20 194 lb 12.8 oz (88.4 kg)  01/10/19 179 lb (81.2 kg)  05/23/18 192 lb (87.1 kg)    Physical Exam Constitutional:      General: He is not in acute distress.    Appearance: He  is well-developed.     Comments: NAD  Eyes:     Conjunctiva/sclera: Conjunctivae normal.     Pupils: Pupils are equal, round, and reactive to light.  Neck:     Thyroid: No thyromegaly.     Vascular: No JVD.  Cardiovascular:     Rate and Rhythm: Normal rate and regular rhythm.     Heart sounds: Normal heart sounds. No murmur heard.  No friction rub. No gallop.   Pulmonary:     Effort: Pulmonary effort is normal. No respiratory distress.     Breath sounds: Normal breath sounds. No wheezing or rales.  Chest:     Chest wall: No tenderness.  Abdominal:     General: Bowel sounds are normal. There is no distension.     Palpations: Abdomen is soft. There is no mass.     Tenderness: There is no abdominal tenderness. There is no guarding or rebound.  Musculoskeletal:        General: No tenderness. Normal range of motion.     Cervical back: Normal range of motion.  Lymphadenopathy:     Cervical: No cervical adenopathy.  Skin:    General: Skin is warm and dry.     Findings: No rash.  Neurological:     Mental Status: He is alert and oriented to person, place, and time.     Cranial Nerves: No cranial  nerve deficit.     Motor: No abnormal muscle tone.     Coordination: Coordination normal.     Gait: Gait normal.     Deep Tendon Reflexes: Reflexes are normal and symmetric.  Psychiatric:        Behavior: Behavior normal.        Thought Content: Thought content normal.        Judgment: Judgment normal.     Lab Results  Component Value Date   WBC 4.2 01/08/2020   HGB 14.2 01/08/2020   HCT 41.8 01/08/2020   PLT 238.0 01/08/2020   GLUCOSE 99 01/08/2020   CHOL 157 01/08/2020   TRIG 59.0 01/08/2020   HDL 40.50 01/08/2020   LDLCALC 105 (H) 01/08/2020   ALT 10 01/08/2020   AST 15 01/08/2020   NA 141 01/08/2020   K 4.1 01/08/2020   CL 106 01/08/2020   CREATININE 1.07 01/08/2020   BUN 16 01/08/2020   CO2 28 01/08/2020   TSH 0.46 01/08/2020   PSA 1.66 01/08/2020    CT CARDIAC  SCORING  Addendum Date: 02/07/2019   ADDENDUM REPORT: 02/07/2019 17:35 CLINICAL DATA:  Risk stratification EXAM: Coronary Calcium Score TECHNIQUE: The patient was scanned on a [employer] scanner. Axial non-contrast 3 mm slices were carried out through the heart. The data set was analyzed on a dedicated work station and scored using the Humphrey. FINDINGS: Non-cardiac: See separate report from [facility]. Ascending Aorta: Normal Caliber.  Mild Calcifications noted. Pericardium: Normal Coronary arteries: Normal coronary origins. Coronary calcifications in the LM and LAD. IMPRESSION: Coronary calcium score of 4. This was 34th percentile for age and sex matched control. Derrick Walters Electronically Signed   By: Derrick Walters   On: 02/07/2019 17:35   Result Date: 02/07/2019 EXAM: OVER-READ INTERPRETATION  CT CHEST The following report is an over-read performed by radiologist Dr. Suzy Walters of [facility], [place] on 02/07/2019. This over-read does not include interpretation of cardiac or coronary anatomy or pathology. The coronary calcium score interpretation by the cardiologist is attached. COMPARISON:  None. FINDINGS: Limited view of the lung parenchyma demonstrates no suspicious nodularity. Airways are normal. Limited view of the mediastinum demonstrates no adenopathy. Esophagus normal. Limited view of the upper abdomen unremarkable. Limited view of the skeleton and chest wall is unremarkable. IMPRESSION: No significant extracardiac findings. Electronically Signed: By: Derrick Walters M.D. On: 02/07/2019 16:47    Assessment & Plan:    Derrick Kehr, MD

## 2020-01-14 NOTE — Assessment & Plan Note (Signed)
On Levothroid 

## 2020-01-14 NOTE — Assessment & Plan Note (Signed)
Coronary calcium score of 4.

## 2020-01-14 NOTE — Assessment & Plan Note (Signed)
Coronary calcium CT score is 4 (12/20) Red rice yeast extract, Flaxseed oil.Declined statins

## 2020-02-11 ENCOUNTER — Encounter: Payer: Self-pay | Admitting: Internal Medicine

## 2020-02-17 IMAGING — DX RIGHT GREAT TOE
3 series · 3 of 3 positions shown · non-contrast
Comparison: None.

CLINICAL DATA: Total infection

EXAM:
RIGHT GREAT TOE

[toe ap]
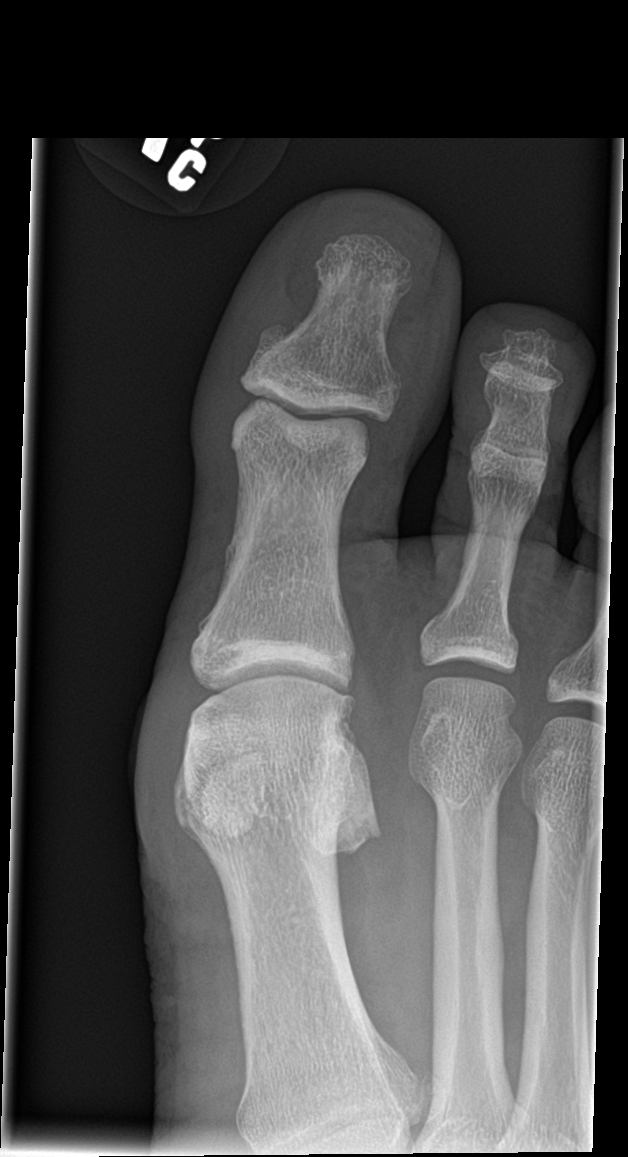

[toe obl]
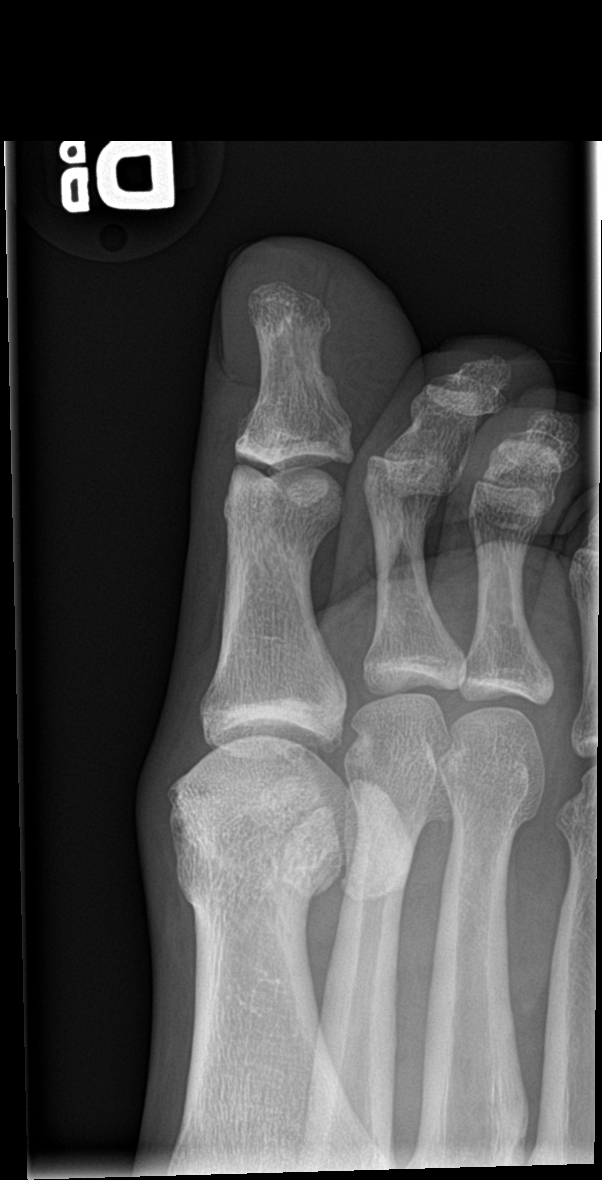

[toe lat]
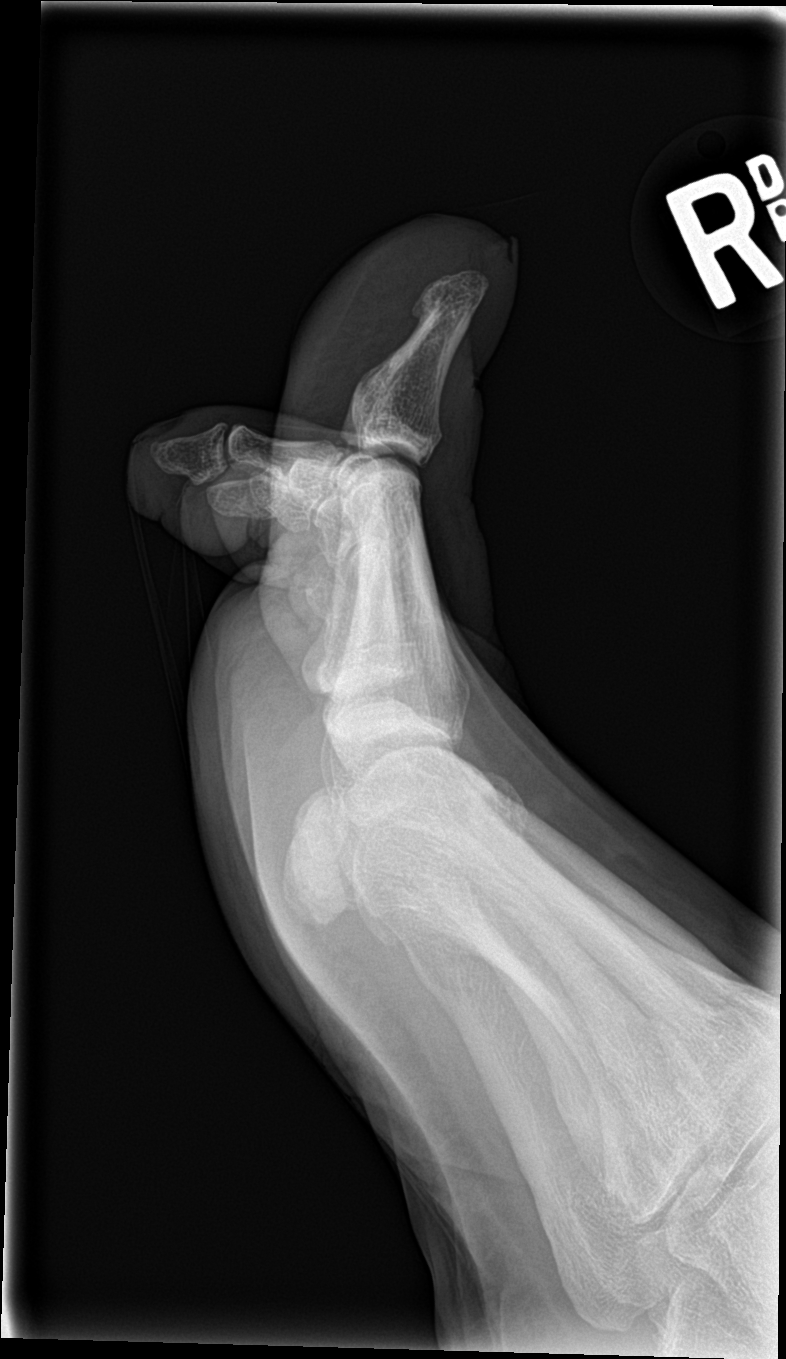

[3 of 3 positions shown; findings below may reference images not displayed]

FINDINGS: There is no evidence of fracture or dislocation. There is no
evidence of arthropathy or other focal bone abnormality. No soft
tissue emphysema.
IMPRESSION: No acute osseous abnormality

## 2020-02-18 IMAGING — US RIGHT LOWER EXTREMITY VENOUS ULTRASOUND
1 series · 13 of 24 positions shown · non-contrast
Comparison: None.

CLINICAL DATA: Right lower extremity cellulitis and edema.



[Series 1: right lower extremity venous ultrasound · 13 of 37 slices shown]
[im 1/37]
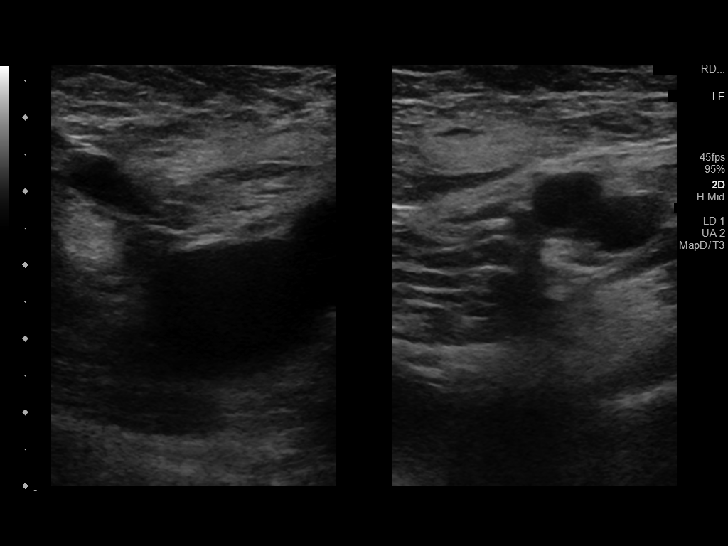
[im 4/37]
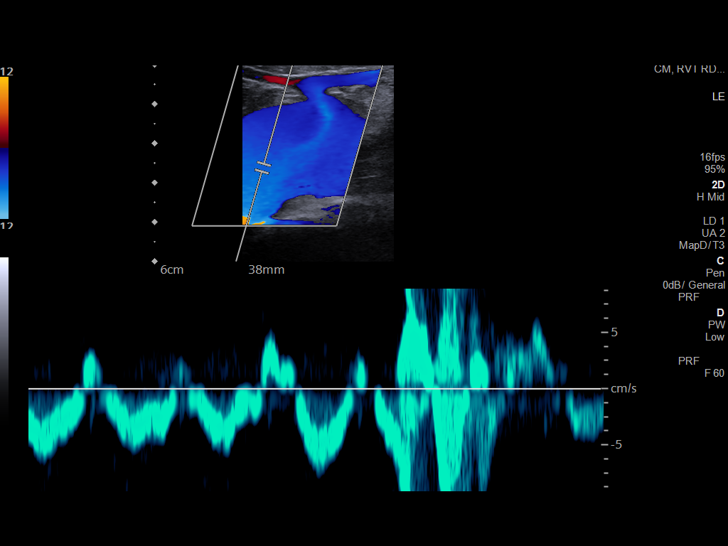
[im 7/37]
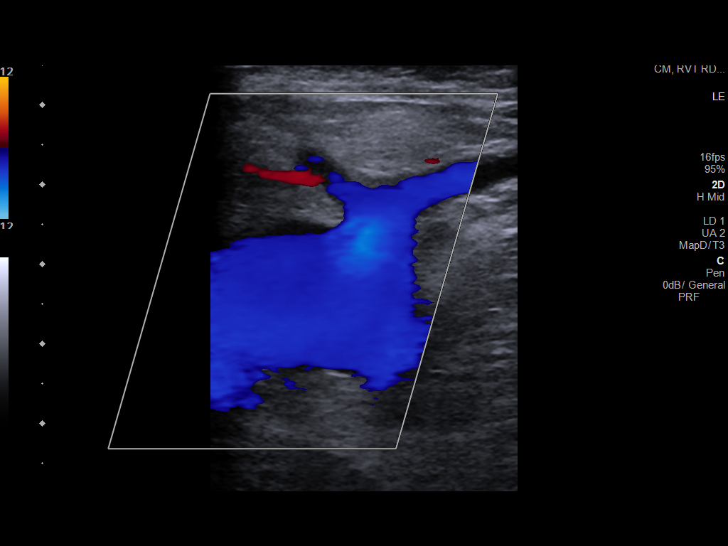
[im 10/37]
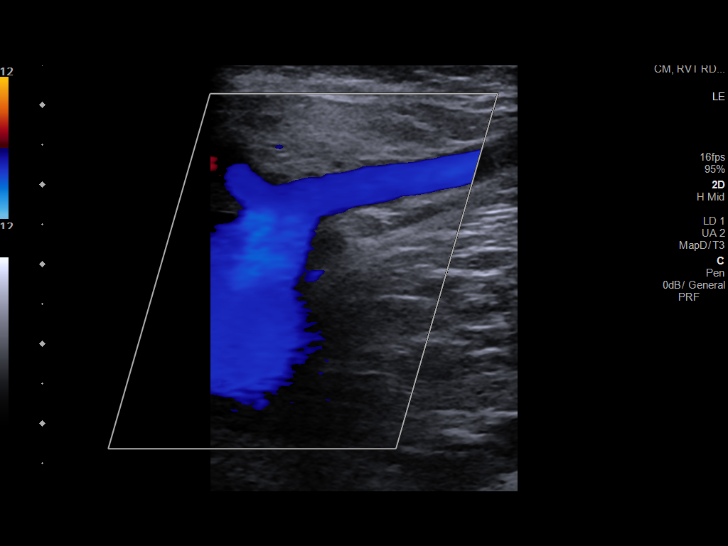
[im 13/37]
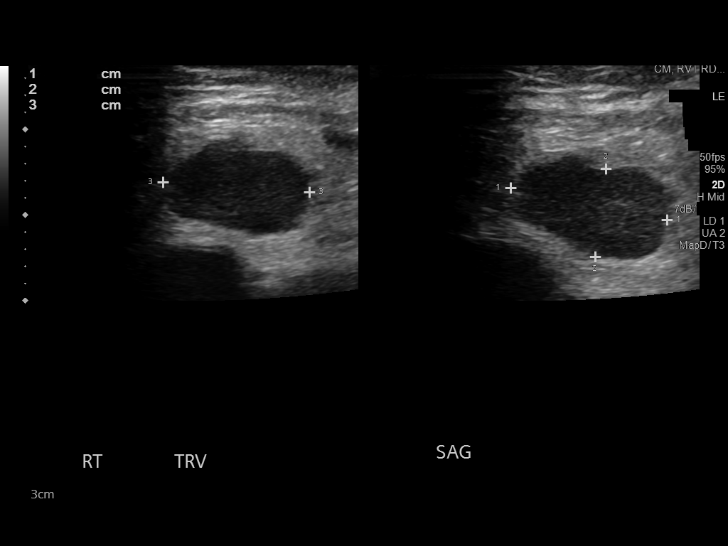
[im 16/37]
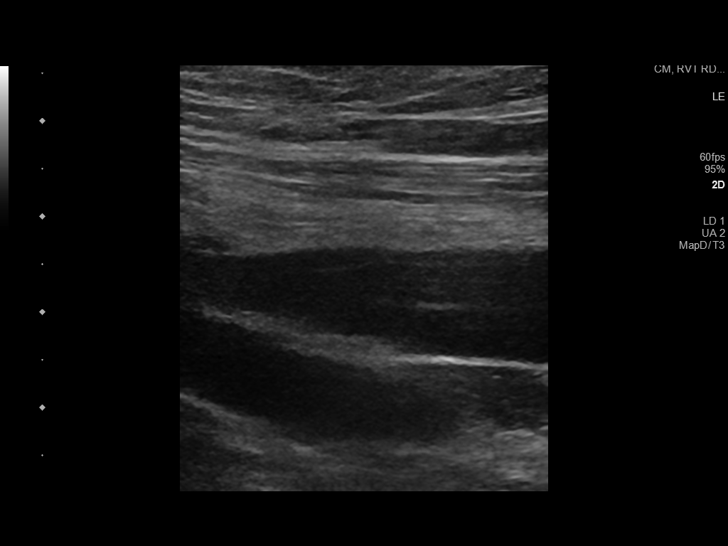
[im 19/37]
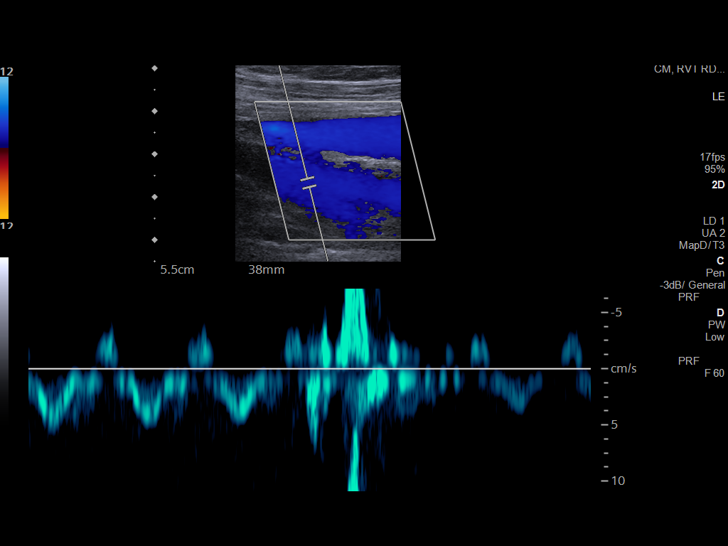
[im 21/37]
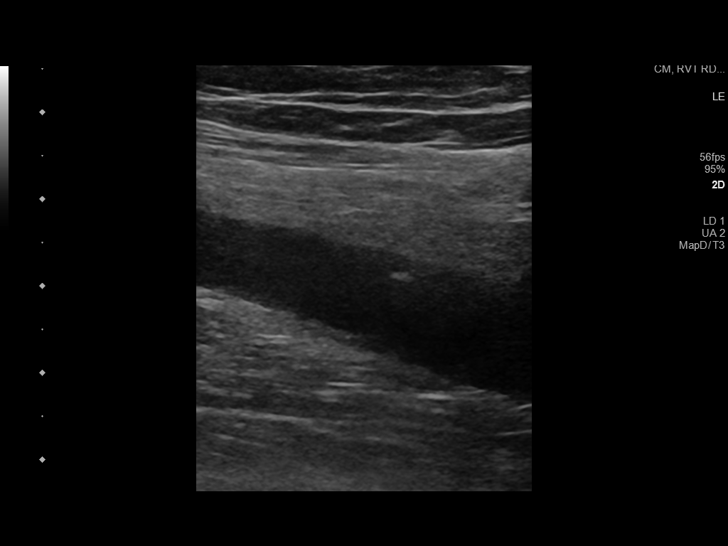
[im 24/37]
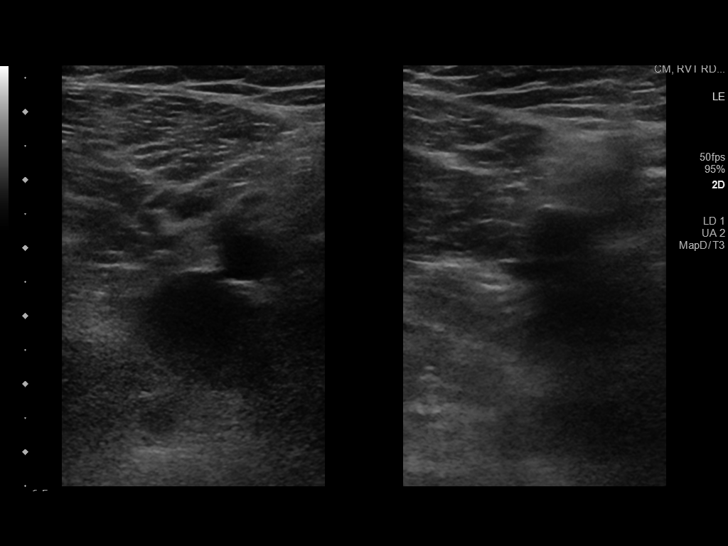
[im 27/37]
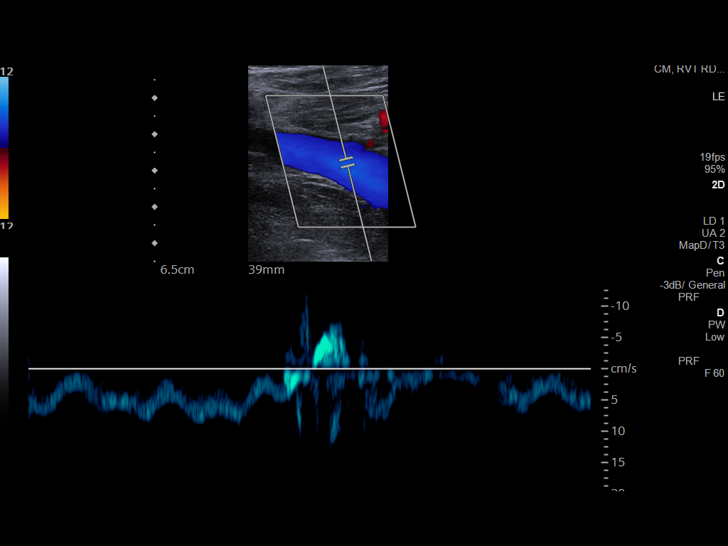
[im 30/37]
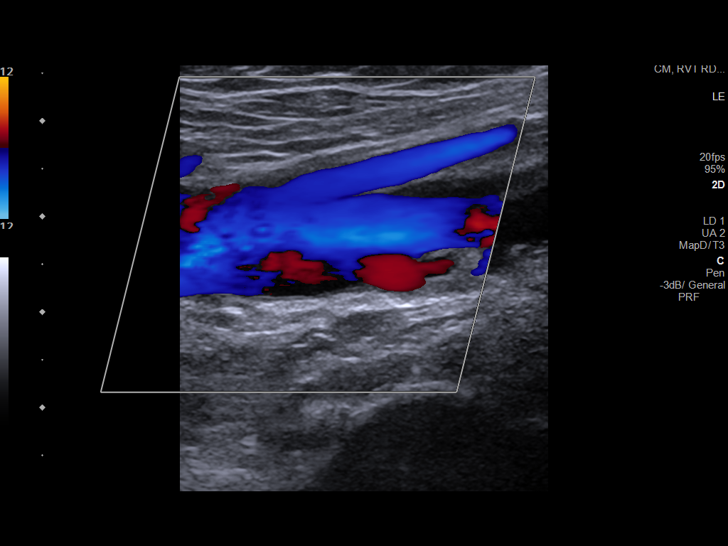
[im 33/37]
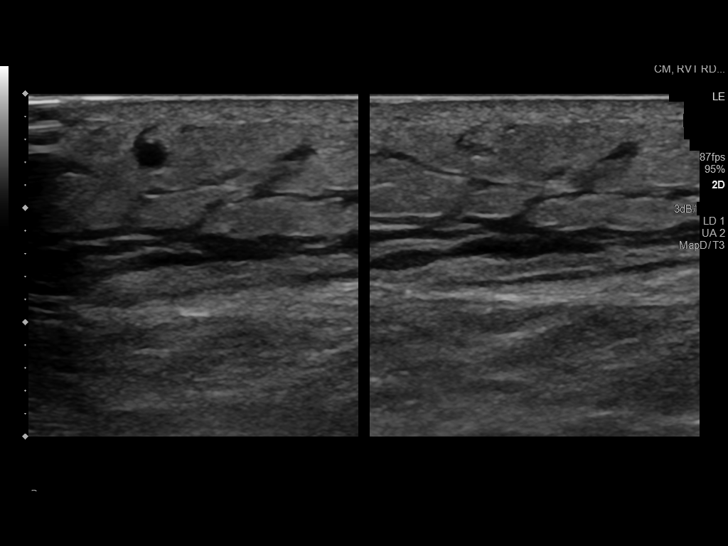
[im 37/37]
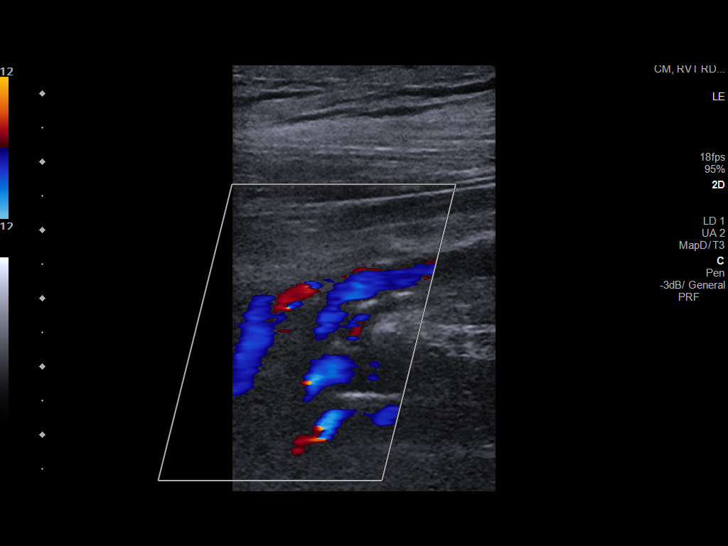

[13 of 24 positions shown; findings below may reference images not displayed]

FINDINGS: Contralateral Common Femoral Vein: Respiratory phasicity is normal
and symmetric with the symptomatic side. No evidence of thrombus.
Normal compressibility.

Common Femoral Vein: No evidence of thrombus. Normal
compressibility, respiratory phasicity and response to augmentation.

Saphenofemoral Junction: No evidence of thrombus. Normal
compressibility and flow on color Doppler imaging.

Profunda Femoral Vein: No evidence of thrombus. Normal
compressibility and flow on color Doppler imaging.

Femoral Vein: No evidence of thrombus. Normal compressibility,
respiratory phasicity and response to augmentation.

Popliteal Vein: No evidence of thrombus. Normal compressibility,
respiratory phasicity and response to augmentation.

Calf Veins: No evidence of thrombus. Normal compressibility and flow
on color Doppler imaging.

Superficial Great Saphenous Vein: No evidence of thrombus. Normal
compressibility.

Venous Reflux:  None.

Other Findings: No evidence of superficial thrombophlebitis or
abnormal fluid collection. Mildly prominent but nonenlarged right
inguinal lymph node measures 1.0 cm in short axis and is likely
reactive.
IMPRESSION: No evidence of right lower extremity deep venous thrombosis.

## 2020-03-03 ENCOUNTER — Encounter: Payer: Self-pay | Admitting: Internal Medicine

## 2020-04-21 ENCOUNTER — Encounter: Payer: BC Managed Care – PPO | Admitting: Internal Medicine

## 2020-05-05 ENCOUNTER — Ambulatory Visit (AMBULATORY_SURGERY_CENTER): Payer: BC Managed Care – PPO

## 2020-05-05 ENCOUNTER — Other Ambulatory Visit: Payer: Self-pay

## 2020-05-05 VITALS — Ht 72.0 in | Wt 189.0 lb

## 2020-05-05 DIAGNOSIS — Z1211 Encounter for screening for malignant neoplasm of colon: Secondary | ICD-10-CM

## 2020-05-05 NOTE — Progress Notes (Signed)

## 2020-05-06 ENCOUNTER — Other Ambulatory Visit: Payer: Self-pay | Admitting: Internal Medicine

## 2020-05-06 ENCOUNTER — Encounter: Payer: Self-pay | Admitting: Internal Medicine

## 2020-05-19 ENCOUNTER — Encounter: Payer: Self-pay | Admitting: Internal Medicine

## 2020-05-19 ENCOUNTER — Other Ambulatory Visit: Payer: Self-pay

## 2020-05-19 ENCOUNTER — Ambulatory Visit (AMBULATORY_SURGERY_CENTER): Payer: BC Managed Care – PPO | Admitting: Internal Medicine

## 2020-05-19 VITALS — BP 108/74 | HR 70 | Temp 97.8°F | Resp 18 | Ht 72.0 in | Wt 189.0 lb

## 2020-05-19 DIAGNOSIS — D124 Benign neoplasm of descending colon: Secondary | ICD-10-CM | POA: Diagnosis not present

## 2020-05-19 DIAGNOSIS — Z860101 Personal history of adenomatous and serrated colon polyps: Secondary | ICD-10-CM

## 2020-05-19 DIAGNOSIS — D120 Benign neoplasm of cecum: Secondary | ICD-10-CM

## 2020-05-19 DIAGNOSIS — Z1211 Encounter for screening for malignant neoplasm of colon: Secondary | ICD-10-CM

## 2020-05-19 DIAGNOSIS — Z86010 Personal history of colonic polyps: Secondary | ICD-10-CM

## 2020-05-19 HISTORY — DX: Personal history of adenomatous and serrated colon polyps: Z86.0101

## 2020-05-19 HISTORY — DX: Personal history of colonic polyps: Z86.010

## 2020-05-19 MED ORDER — SODIUM CHLORIDE 0.9 % IV SOLN: 500.0000 mL | Freq: Once | INTRAVENOUS | Status: DC

## 2020-05-19 NOTE — Patient Instructions (Addendum)
I found and removed two very tiny polyps.  I will let you know pathology results and when to have another routine colonoscopy by mail and/or My Chart.  I appreciate the opportunity to care for you. Gatha Mayer, MD, FACG  YOU HAD AN ENDOSCOPIC PROCEDURE TODAY AT [facility]:   Refer to the procedure report that was given to you for any specific questions about what was found during the examination.  If the procedure report does not answer your questions, please call your gastroenterologist to clarify.  If you requested that your care partner not be given the details of your procedure findings, then the procedure report has been included in a sealed envelope for you to review at your convenience later.  YOU SHOULD EXPECT: Some feelings of bloating in the abdomen. Passage of more gas than usual.  Walking can help get rid of the air that was put into your GI tract during the procedure and reduce the bloating. If you had a lower endoscopy (such as a colonoscopy or flexible sigmoidoscopy) you may notice spotting of blood in your stool or on the toilet paper. If you underwent a bowel prep for your procedure, you may not have a normal bowel movement for a few days.  Please Note:  You might notice some irritation and congestion in your nose or some drainage.  This is from the oxygen used during your procedure.  There is no need for concern and it should clear up in a day or so.  SYMPTOMS TO REPORT IMMEDIATELY:   Following lower endoscopy (colonoscopy or flexible sigmoidoscopy):  Excessive amounts of blood in the stool  Significant tenderness or worsening of abdominal pains  Swelling of the abdomen that is new, acute  Fever of 100F or higher   For urgent or emergent issues, a gastroenterologist can be reached at any hour by calling [phone]. Do not use MyChart messaging for urgent concerns.    DIET:  We do recommend a small meal at first, but then you may proceed to your  regular diet.  Drink plenty of fluids but you should avoid alcoholic beverages for 24 hours.  ACTIVITY:  You should plan to take it easy for the rest of today and you should NOT DRIVE or use heavy machinery until tomorrow (because of the sedation medicines used during the test).    FOLLOW UP: Our staff will call the number listed on your records 48-72 hours following your procedure to check on you and address any questions or concerns that you may have regarding the information given to you following your procedure. If we do not reach you, we will leave a message.  We will attempt to reach you two times.  During this call, we will ask if you have developed any symptoms of COVID 19. If you develop any symptoms (ie: fever, flu-like symptoms, shortness of breath, cough etc.) before then, please call [phone].  If you test positive for Covid 19 in the 2 weeks post procedure, please call and report this information to [place].    If any biopsies were taken you will be contacted by phone or by letter within the next 1-3 weeks.  Please call us at [phone] if you have not heard about the biopsies in 3 weeks.    SIGNATURES/CONFIDENTIALITY: You and/or your care partner have signed paperwork which will be entered into your electronic medical record.  These signatures attest to the fact that that the information above on your  After Visit Summary has been reviewed and is understood.  Full responsibility of the confidentiality of this discharge information lies with you and/or your care-partner.

## 2020-05-19 NOTE — Progress Notes (Signed)
PT taken to PACU. Monitors in place. VSS. Report given to RN. 

## 2020-05-19 NOTE — Op Note (Signed)
[facility] Patient Name: Derrick Walters Procedure Date: 05/19/2020 9:28 AM MRN: [id number] Endoscopist: Gatha Mayer , MD Age: 60 Referring MD:  Date of Birth: Nov 28, 1960 Gender: Male Account #: [id number] Procedure:                Colonoscopy Indications:              Screening for colorectal malignant neoplasm, Last                            colonoscopy: 2011 Medicines:                Propofol per Anesthesia, Monitored Anesthesia Care Procedure:                Pre-Anesthesia Assessment:                           - Prior to the procedure, a History and Physical                            was performed, and patient medications and                            allergies were reviewed. The patient's tolerance of                            previous anesthesia was also reviewed. The risks                            and benefits of the procedure and the sedation                            options and risks were discussed with the patient.                            All questions were answered, and informed consent                            was obtained. Prior Anticoagulants: The patient has                            taken no previous anticoagulant or antiplatelet                            agents. ASA Grade Assessment: II - A patient with                            mild systemic disease. After reviewing the risks                            and benefits, the patient was deemed in                            satisfactory condition to undergo the procedure.  After obtaining informed consent, the colonoscope                            was passed under direct vision. Throughout the                            procedure, the patient's blood pressure, pulse, and                            oxygen saturations were monitored continuously. The                            Olympus CF-HQ190 [id number]) Colonoscope was                            introduced through the  anus and advanced to the the                            cecum, identified by appendiceal orifice and                            ileocecal valve. The colonoscopy was performed                            without difficulty. The patient tolerated the                            procedure well. The quality of the bowel                            preparation was adequate. The bowel preparation                            used was Miralax via split dose instruction. The                            ileocecal valve, appendiceal orifice, and rectum                            were photographed. Scope In: 10:26:40 AM Scope Out: 10:45:11 AM Scope Withdrawal Time: 0 hours 15 minutes 25 seconds  Total Procedure Duration: 0 hours 18 minutes 31 seconds  Findings:                 The perianal and digital rectal examinations were                            normal. Pertinent negatives include normal prostate                            (size, shape, and consistency).                           A 1 mm polyp was found in the descending colon. The  polyp was sessile. The polyp was removed with a                            cold biopsy forceps. Resection and retrieval were                            complete. Verification of patient identification                            for the specimen was done. Estimated blood loss was                            minimal.                           A 2 to 3 mm polyp was found in the cecum. The polyp                            was sessile. The polyp was removed with a cold                            snare. Resection and retrieval were complete.                            Verification of patient identification for the                            specimen was done. Estimated blood loss was minimal.                           The exam was otherwise without abnormality on                            direct and retroflexion views. Complications:            No  immediate complications. Estimated Blood Loss:     Estimated blood loss was minimal. Impression:               - One 1 mm polyp in the descending colon, removed                            with a cold biopsy forceps. Resected and retrieved.                           - One 2 to 3 mm polyp in the cecum, removed with a                            cold snare. Resected and retrieved.                           - The examination was otherwise normal on direct                            and  retroflexion views. Recommendation:           - Patient has a contact number available for                            emergencies. The signs and symptoms of potential                            delayed complications were discussed with the                            patient. Return to normal activities tomorrow.                            Written discharge instructions were provided to the                            patient.                           - Resume previous diet.                           - Continue present medications.                           - Repeat colonoscopy is recommended. The                            colonoscopy date will be determined after pathology                            results from today's exam become available for                            review. Gatha Mayer, MD 05/19/2020 10:52:32 AM This report has been signed electronically.

## 2020-05-19 NOTE — Progress Notes (Signed)
Pt's states no medical or surgical changes since previsit or office visit.  Check-in-AER  Vital signs-CW 

## 2020-05-19 NOTE — Progress Notes (Signed)
Called to room to assist during endoscopic procedure.  Patient ID and intended procedure confirmed with present staff. Received instructions for my participation in the procedure from the performing physician.  

## 2020-05-21 ENCOUNTER — Telehealth: Payer: Self-pay | Admitting: *Deleted

## 2020-05-21 NOTE — Telephone Encounter (Signed)
  Follow up Call-  Call back number 05/19/2020  Post procedure Call Back phone  # [phone]  Permission to leave phone message Yes  Some recent data might be hidden         Patient questions:  Do you have a fever, pain , or abdominal swelling? No. Pain Score  0 *  Have you tolerated food without any problems? Yes.    Have you been able to return to your normal activities? Yes.    Do you have any questions about your discharge instructions: Diet   No. Medications  No. Follow up visit  No.  Do you have questions or concerns about your Care? No.  Actions: * If pain score is 4 or above: No action needed, pain <4.  1. Have you developed a fever since your procedure? no  2.   Have you had an respiratory symptoms (SOB or cough) since your procedure? no  3.   Have you tested positive for COVID 19 since your procedure no  4.   Have you had any family members/close contacts diagnosed with the COVID 19 since your procedure?  no   If yes to any of these questions please route to Joylene John, RN and Joella Prince, RN

## 2020-06-02 ENCOUNTER — Encounter: Payer: Self-pay | Admitting: Internal Medicine

## 2020-06-09 ENCOUNTER — Ambulatory Visit: Payer: BC Managed Care – PPO | Admitting: Internal Medicine

## 2020-11-09 IMAGING — CT CT HEART SCORING
2 series · 16 of 20 positions shown, 18 images · non-contrast
Comparison: None.
COMPARISON: None.

Addendum:
EXAM:
OVER-READ INTERPRETATION  CT CHEST

The following report is an over-read performed by radiologist Dr.
Meedo Isalm [REDACTED] on 02/07/2019. This
over-read does not include interpretation of cardiac or coronary
anatomy or pathology. The coronary calcium score interpretation by
the cardiologist is attached.
CLINICAL DATA: Risk stratification
Coronary Calcium Score
TECHNIQUE: The patient was scanned on a Siem[employer] Force scanner. Axial
non-contrast 3 mm slices were carried out through the heart. The
data set was analyzed on a dedicated work station and scored using
the Agatson method.

[Series 2: casc 3.0 i36f 2 bestdiast 68 % · axial · 0.35mm/px · z∈[-277,-151]mm · 8 of 56 slices shown, 10 images]
[im 7/56  vessel]
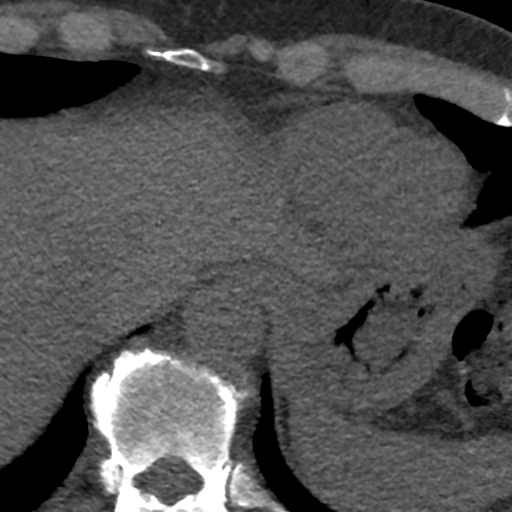
[im 7/56  lung]
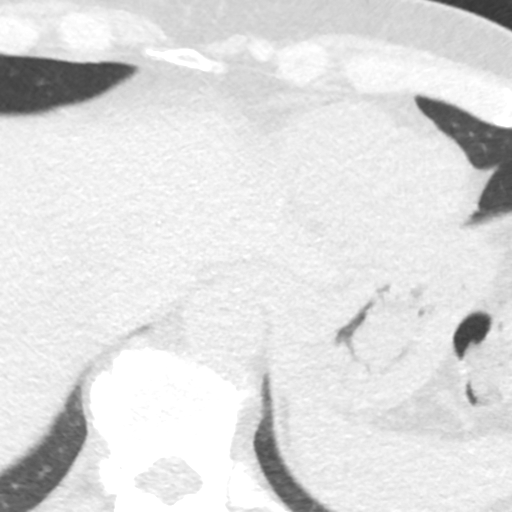
[im 13/56  vessel]
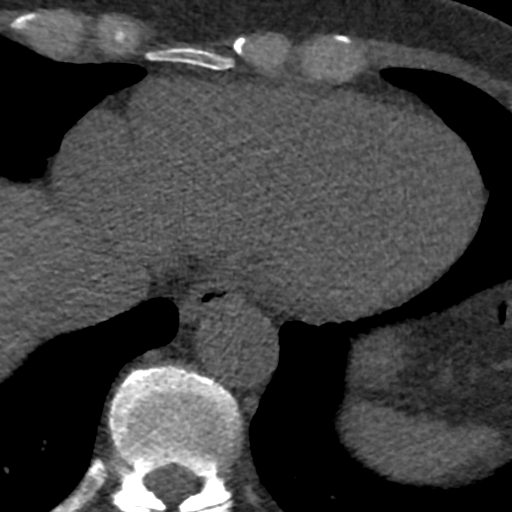
[im 19/56  vessel]
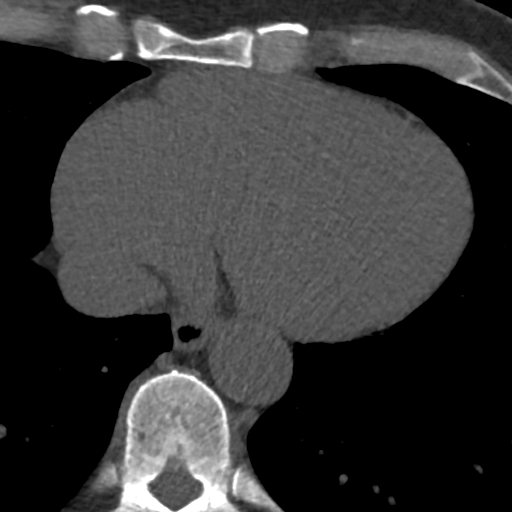
[im 25/56  vessel]
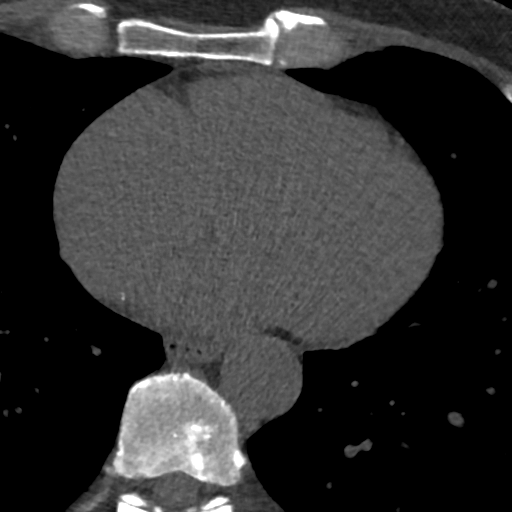
[im 31/56  vessel]
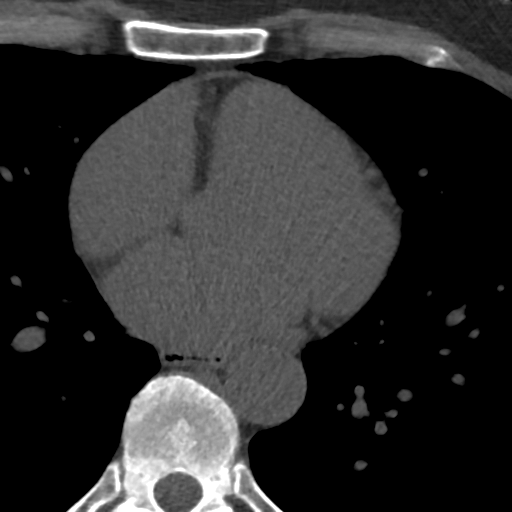
[im 31/56  lung]
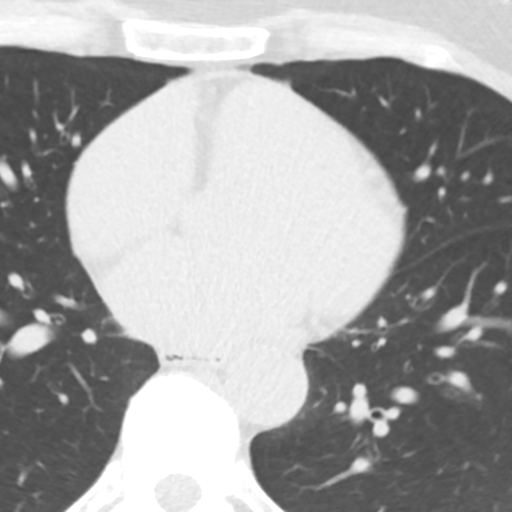
[im 37/56  vessel]
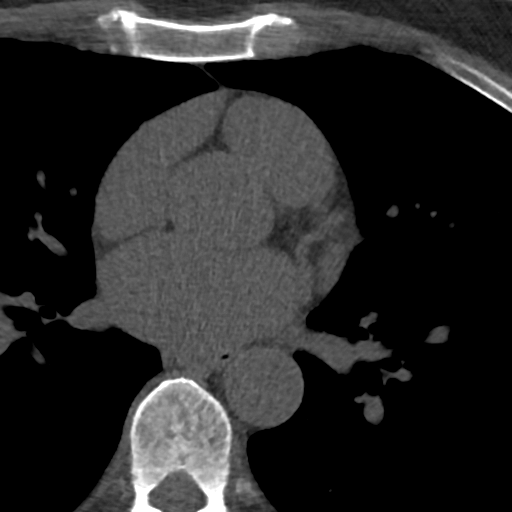
[im 43/56  vessel]
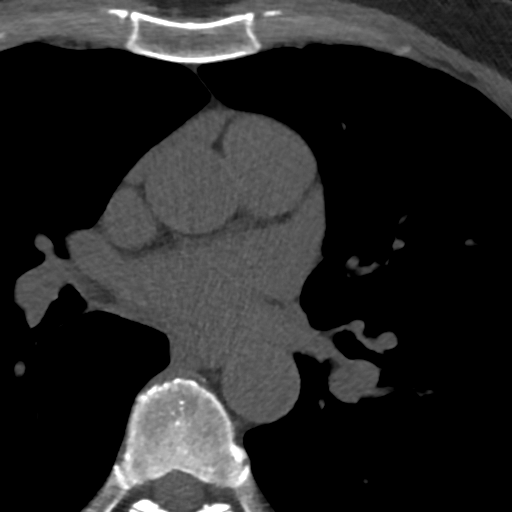
[im 49/56  vessel]
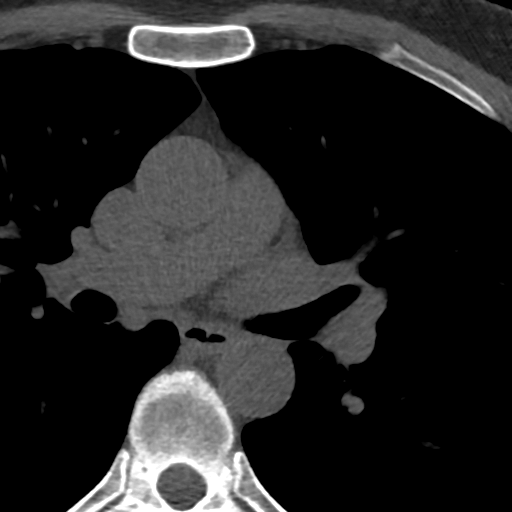

[Series 4: lung st 72 % · axial · 0.76mm/px · z∈[-276,-150]mm · 8 of 56 slices shown]
[im 7/56  lung]
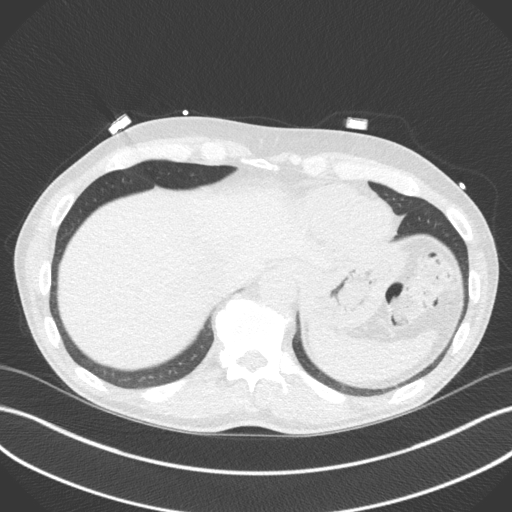
[im 13/56  lung]
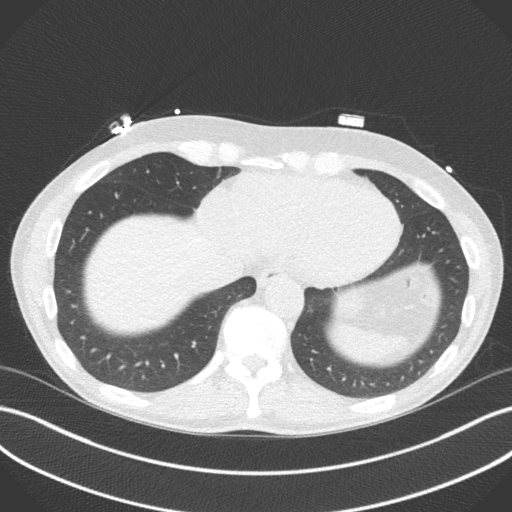
[im 19/56  lung]
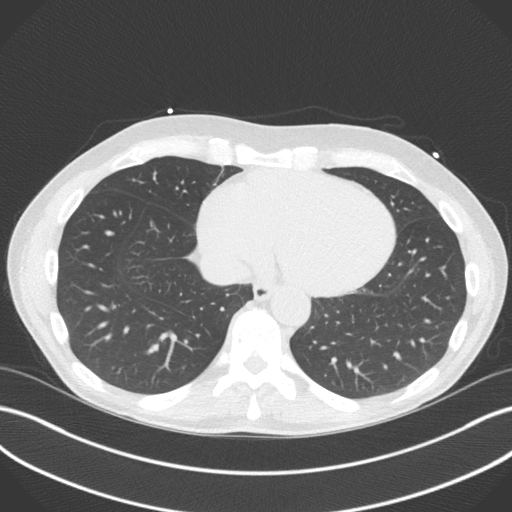
[im 25/56  lung]
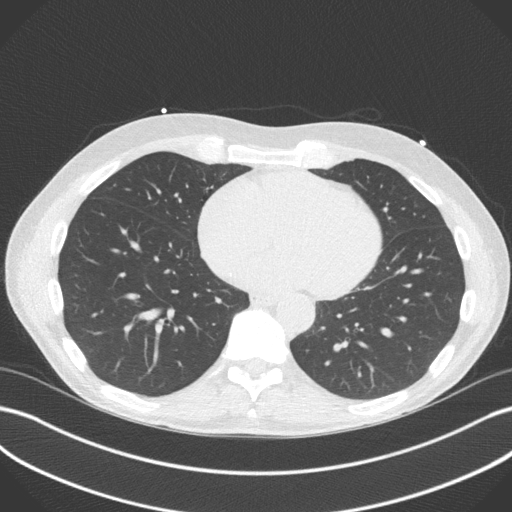
[im 31/56  lung]
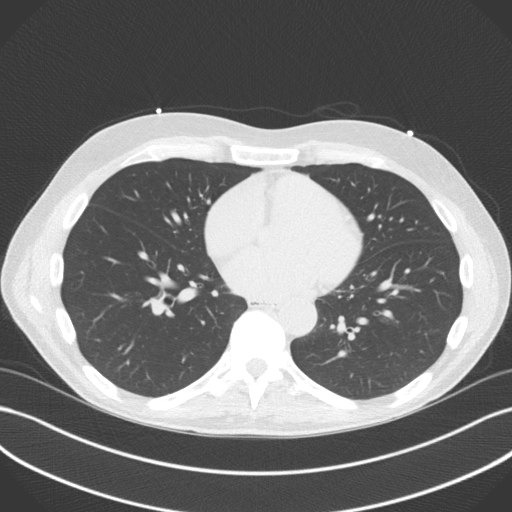
[im 37/56  lung]
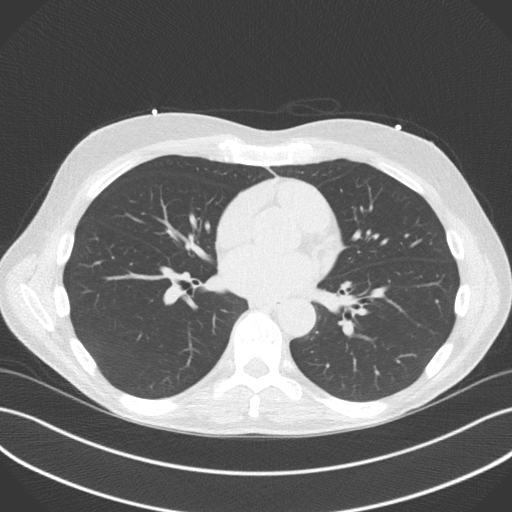
[im 43/56  lung]
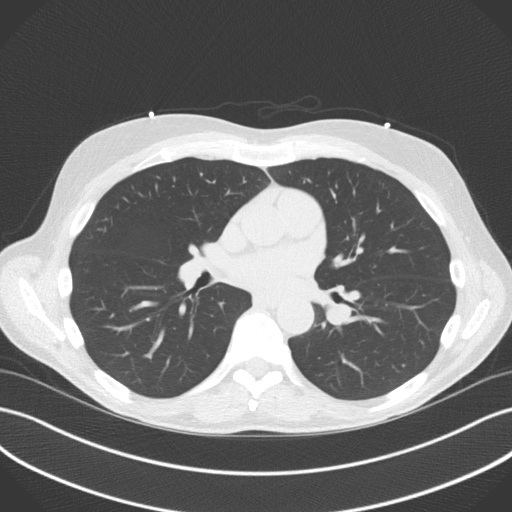
[im 49/56  lung]
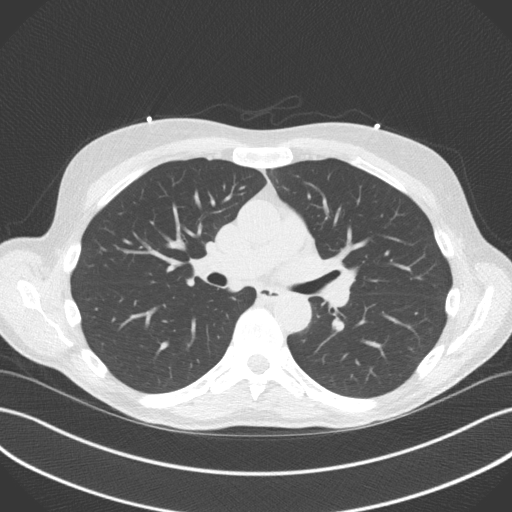

[16 of 20 positions shown; findings below may reference images not displayed]

FINDINGS: Limited view of the lung parenchyma demonstrates no suspicious
nodularity. Airways are normal.

Limited view of the mediastinum demonstrates no adenopathy.
Esophagus normal.

Limited view of the upper abdomen unremarkable.

Limited view of the skeleton and chest wall is unremarkable.
IMPRESSION: No significant extracardiac findings.
FINDINGS: Non-cardiac: See separate report from [REDACTED].

Ascending Aorta: Normal Caliber.  Mild Calcifications noted.

Pericardium: Normal

Coronary arteries: Normal coronary origins. Coronary calcifications
in the LM and LAD.
IMPRESSION: Coronary calcium score of 4. This was 34th percentile for age and
sex matched control.

Quirijn Amazigh

*** End of Addendum ***
EXAM:
OVER-READ INTERPRETATION  CT CHEST

The following report is an over-read performed by radiologist Dr.
Meedo Isalm [REDACTED] on 02/07/2019. This
over-read does not include interpretation of cardiac or coronary
anatomy or pathology. The coronary calcium score interpretation by
the cardiologist is attached.
FINDINGS: Limited view of the lung parenchyma demonstrates no suspicious
nodularity. Airways are normal.

Limited view of the mediastinum demonstrates no adenopathy.
Esophagus normal.

Limited view of the upper abdomen unremarkable.

Limited view of the skeleton and chest wall is unremarkable.
IMPRESSION: No significant extracardiac findings.

## 2021-01-24 ENCOUNTER — Other Ambulatory Visit: Payer: Self-pay | Admitting: Internal Medicine

## 2021-02-02 ENCOUNTER — Telehealth: Payer: BC Managed Care – PPO

## 2021-02-03 ENCOUNTER — Encounter: Payer: Self-pay | Admitting: Family Medicine

## 2021-02-03 ENCOUNTER — Telehealth (INDEPENDENT_AMBULATORY_CARE_PROVIDER_SITE_OTHER): Payer: BC Managed Care – PPO | Admitting: Family Medicine

## 2021-02-03 VITALS — Temp 98.0°F | Wt 195.0 lb

## 2021-02-03 DIAGNOSIS — R519 Headache, unspecified: Secondary | ICD-10-CM | POA: Diagnosis not present

## 2021-02-03 DIAGNOSIS — R0981 Nasal congestion: Secondary | ICD-10-CM

## 2021-02-03 MED ORDER — DOXYCYCLINE HYCLATE 100 MG PO TABS: 100.0000 mg | ORAL_TABLET | Freq: Two times a day (BID) | ORAL | 0 refills | Status: DC

## 2021-02-03 NOTE — Patient Instructions (Signed)
-  I sent the medication(s) we discussed to your pharmacy: Meds ordered this encounter  Medications  . doxycycline (VIBRA-TABS) 100 MG tablet    Sig: Take 1 tablet (100 mg total) by mouth 2 (two) times daily.    Dispense:  20 tablet    Refill:  0     I hope you are feeling better soon!  Seek in person care promptly if your symptoms worsen, new concerns arise or you are not improving with treatment.  It was nice to meet you today. I help [facility] out with telemedicine visits on Tuesdays and Thursdays and am available for visits on those days. If you have any concerns or questions following this visit please schedule a follow up visit with your Primary Care doctor or seek care at a local urgent care clinic to avoid delays in care.  

## 2021-02-03 NOTE — Progress Notes (Signed)
Virtual Visit via Video Note  I connected with Derrick Walters  on 02/03/21 at 10:00 AM EST by a video enabled telemedicine application and verified that I am speaking with the correct person using two identifiers.  Location patient: work, [place] Location provider:work or home office Persons participating in the virtual visit: patient, provider  I discussed the limitations of evaluation and management by telemedicine and the availability of in person appointments. The patient expressed understanding and agreed to proceed.   HPI:  Acute telemedicine visit for sinus issues: -Onset: about 2-3 weeks ago -Symptoms include: nasal sinus congestion, now has gotten worse with green mucus, congestion in the eyes and sinus congestion sinus discomfort and sinus headache -covid testing was negative -Denies: fevers, SOB, CP, body aches -Has tried:Dayquil; and over the counter sinus medication - not helping; has been using nettie pot -Pertinent past medical history: see below -Pertinent medication allergies: No Known Allergies -COVID-19 vaccine status:  ROS: See pertinent positives and negatives per HPI.  Past Medical History:  Diagnosis Date   Anemia    Blood donor    q 2-3 mo   History of nephrolithiasis    Hx of adenomatous colonic polyps 05/19/2020   Hyperlipidemia    Hypothyroidism     Past Surgical History:  Procedure Laterality Date   COLONOSCOPY  2011   TONSILLECTOMY       Current Outpatient Medications:    acyclovir (ZOVIRAX) 200 MG capsule, Take 200 mg by mouth daily., Disp: , Rfl:    Ascorbic Acid (VITAMIN C) 1000 MG tablet, Take 1,000 mg by mouth daily., Disp: , Rfl:    Cholecalciferol 1000 UNITS tablet, Take 1,000 Units by mouth daily., Disp: , Rfl:    doxycycline (VIBRA-TABS) 100 MG tablet, Take 1 tablet (100 mg total) by mouth 2 (two) times daily., Disp: 20 tablet, Rfl: 0   Flaxseed, Linseed, (FLAX SEEDS PO), Take 1 tablet by mouth daily. , Disp: , Rfl:    Red Yeast Rice Extract  (RED YEAST RICE PO), Take by mouth. 1200 mg, Disp: , Rfl:    SYNTHROID 150 MCG tablet, TAKE 1 TABLET DAILY BEFORE BREAKFAST, Disp: 90 tablet, Rfl: 2  EXAM:  VITALS per patient if applicable:  GENERAL: alert, oriented, appears well and in no acute distress  HEENT: atraumatic, conjunttiva clear, no obvious abnormalities on inspection of external nose and ears  NECK: normal movements of the head and neck  LUNGS: on inspection no signs of respiratory distress, breathing rate appears normal, no obvious gross SOB, gasping or wheezing  CV: no obvious cyanosis  MS: moves all visible extremities without noticeable abnormality  PSYCH/NEURO: pleasant and cooperative, no obvious depression or anxiety, speech and thought processing grossly intact  ASSESSMENT AND PLAN:  Discussed the following assessment and plan:  Nasal congestion  Facial discomfort  -we discussed possible serious and likely etiologies, options for evaluation and workup, limitations of telemedicine visit vs in person visit, treatment, treatment risks and precautions. Pt is agreeable to treatment via telemedicine at this moment. Query lingering VURI symptoms vs possible bacterial sinusitis given worsening and duration of symptoms vs other. He has opted to try empiric treatment with doxy 100mg  bid x 7-10 days.  Advised to seek prompt in person care if worsening, new symptoms arise, or if is not improving with treatment. Discussed options for inperson care if PCP office not available. I discussed the assessment and treatment plan with the patient. The patient was provided an opportunity to ask questions and all were answered.  The patient agreed with the plan and demonstrated an understanding of the instructions.     Derrick Kern, DO

## 2021-02-24 ENCOUNTER — Telehealth: Payer: Self-pay | Admitting: Internal Medicine

## 2021-02-24 DIAGNOSIS — Z0000 Encounter for general adult medical examination without abnormal findings: Secondary | ICD-10-CM

## 2021-02-24 DIAGNOSIS — E034 Atrophy of thyroid (acquired): Secondary | ICD-10-CM

## 2021-02-24 NOTE — Telephone Encounter (Signed)
Patient requesting order for labs prior to cpe appt on 03-17-2021

## 2021-02-25 NOTE — Telephone Encounter (Signed)
Okay.  Thanks.

## 2021-03-03 ENCOUNTER — Encounter: Payer: Self-pay | Admitting: Internal Medicine

## 2021-03-03 DIAGNOSIS — I2510 Atherosclerotic heart disease of native coronary artery without angina pectoris: Secondary | ICD-10-CM

## 2021-03-03 DIAGNOSIS — I2583 Coronary atherosclerosis due to lipid rich plaque: Secondary | ICD-10-CM

## 2021-03-03 DIAGNOSIS — Z0000 Encounter for general adult medical examination without abnormal findings: Secondary | ICD-10-CM

## 2021-03-03 DIAGNOSIS — R972 Elevated prostate specific antigen [PSA]: Secondary | ICD-10-CM

## 2021-03-03 DIAGNOSIS — E785 Hyperlipidemia, unspecified: Secondary | ICD-10-CM

## 2021-03-03 DIAGNOSIS — E034 Atrophy of thyroid (acquired): Secondary | ICD-10-CM

## 2021-03-09 ENCOUNTER — Other Ambulatory Visit (INDEPENDENT_AMBULATORY_CARE_PROVIDER_SITE_OTHER): Payer: BC Managed Care – PPO

## 2021-03-09 DIAGNOSIS — I2583 Coronary atherosclerosis due to lipid rich plaque: Secondary | ICD-10-CM

## 2021-03-09 DIAGNOSIS — Z0000 Encounter for general adult medical examination without abnormal findings: Secondary | ICD-10-CM | POA: Diagnosis not present

## 2021-03-09 DIAGNOSIS — R972 Elevated prostate specific antigen [PSA]: Secondary | ICD-10-CM

## 2021-03-09 DIAGNOSIS — E785 Hyperlipidemia, unspecified: Secondary | ICD-10-CM

## 2021-03-09 DIAGNOSIS — I2510 Atherosclerotic heart disease of native coronary artery without angina pectoris: Secondary | ICD-10-CM

## 2021-03-09 DIAGNOSIS — E034 Atrophy of thyroid (acquired): Secondary | ICD-10-CM

## 2021-03-09 LAB — CBC WITH DIFFERENTIAL/PLATELET
Basophils Absolute: 0 10*3/uL (ref 0.0–0.1)
Basophils Relative: 1 % (ref 0.0–3.0)
Eosinophils Absolute: 0.1 10*3/uL (ref 0.0–0.7)
Eosinophils Relative: 3.2 % (ref 0.0–5.0)
HCT: 40.2 % (ref 39.0–52.0)
Hemoglobin: 13 g/dL (ref 13.0–17.0)
Lymphocytes Relative: 29.2 % (ref 12.0–46.0)
Lymphs Abs: 1.1 10*3/uL (ref 0.7–4.0)
MCHC: 32.5 g/dL (ref 30.0–36.0)
MCV: 88.5 fl (ref 78.0–100.0)
Monocytes Absolute: 0.3 10*3/uL (ref 0.1–1.0)
Monocytes Relative: 7.3 % (ref 3.0–12.0)
Neutro Abs: 2.2 10*3/uL (ref 1.4–7.7)
Neutrophils Relative %: 59.3 % (ref 43.0–77.0)
Platelets: 235 10*3/uL (ref 150.0–400.0)
RBC: 4.54 Mil/uL (ref 4.22–5.81)
RDW: 15.1 % (ref 11.5–15.5)
WBC: 3.6 10*3/uL — ABNORMAL LOW (ref 4.0–10.5)

## 2021-03-09 LAB — URINALYSIS, ROUTINE W REFLEX MICROSCOPIC
Bilirubin Urine: NEGATIVE
Hgb urine dipstick: NEGATIVE
Ketones, ur: NEGATIVE
Leukocytes,Ua: NEGATIVE
Nitrite: NEGATIVE
RBC / HPF: NONE SEEN (ref 0–?)
Specific Gravity, Urine: 1.015 (ref 1.000–1.030)
Total Protein, Urine: NEGATIVE
Urine Glucose: NEGATIVE
Urobilinogen, UA: 0.2 (ref 0.0–1.0)
WBC, UA: NONE SEEN (ref 0–?)
pH: 6.5 (ref 5.0–8.0)

## 2021-03-09 LAB — COMPREHENSIVE METABOLIC PANEL
ALT: 10 U/L (ref 0–53)
AST: 13 U/L (ref 0–37)
Albumin: 4.3 g/dL (ref 3.5–5.2)
Alkaline Phosphatase: 71 U/L (ref 39–117)
BUN: 13 mg/dL (ref 6–23)
CO2: 28 mEq/L (ref 19–32)
Calcium: 9.2 mg/dL (ref 8.4–10.5)
Chloride: 103 mEq/L (ref 96–112)
Creatinine, Ser: 1.12 mg/dL (ref 0.40–1.50)
GFR: 70.55 mL/min (ref >60.00)
Glucose, Bld: 93 mg/dL (ref 70–99)
Potassium: 4 mEq/L (ref 3.5–5.1)
Sodium: 138 mEq/L (ref 135–145)
Total Bilirubin: 0.9 mg/dL (ref 0.2–1.2)
Total Protein: 7.1 g/dL (ref 6.0–8.3)

## 2021-03-09 LAB — LIPID PANEL
Cholesterol: 179 mg/dL (ref 0–200)
HDL: 38.8 mg/dL — ABNORMAL LOW (ref >39.00)
LDL Cholesterol: 118 mg/dL — ABNORMAL HIGH (ref 0–99)
NonHDL: 140.49
Total CHOL/HDL Ratio: 5
Triglycerides: 110 mg/dL (ref 0.0–149.0)
VLDL: 22 mg/dL (ref 0.0–40.0)

## 2021-03-09 LAB — PSA: PSA: 1.92 ng/mL (ref 0.10–4.00)

## 2021-03-09 LAB — TSH: TSH: 0.31 u[IU]/mL — ABNORMAL LOW (ref 0.35–5.50)

## 2021-03-17 ENCOUNTER — Encounter: Payer: Self-pay | Admitting: Internal Medicine

## 2021-03-17 ENCOUNTER — Other Ambulatory Visit: Payer: Self-pay

## 2021-03-17 ENCOUNTER — Ambulatory Visit (INDEPENDENT_AMBULATORY_CARE_PROVIDER_SITE_OTHER): Payer: BC Managed Care – PPO | Admitting: Internal Medicine

## 2021-03-17 DIAGNOSIS — Z0000 Encounter for general adult medical examination without abnormal findings: Secondary | ICD-10-CM | POA: Diagnosis not present

## 2021-03-17 DIAGNOSIS — R972 Elevated prostate specific antigen [PSA]: Secondary | ICD-10-CM

## 2021-03-17 DIAGNOSIS — E034 Atrophy of thyroid (acquired): Secondary | ICD-10-CM

## 2021-03-17 NOTE — Assessment & Plan Note (Signed)
Abn TSH. Clinically euthyroid. Check FT4 in 4 wks

## 2021-03-17 NOTE — Progress Notes (Signed)
Subjective:  Patient ID: Derrick Walters, male    DOB: 04/18/1960  Age: 61 y.o. MRN: [id number]  CC: Annual Exam   HPI Derrick Walters presents for a well exam  Outpatient Medications Prior to Visit  Medication Sig Dispense Refill   acyclovir (ZOVIRAX) 200 MG capsule Take 200 mg by mouth daily.     Ascorbic Acid (VITAMIN C) 1000 MG tablet Take 1,000 mg by mouth daily.     Cholecalciferol 1000 UNITS tablet Take 1,000 Units by mouth daily.     doxycycline (VIBRA-TABS) 100 MG tablet Take 1 tablet (100 mg total) by mouth 2 (two) times daily. 20 tablet 0   Flaxseed, Linseed, (FLAX SEEDS PO) Take 1 tablet by mouth daily.      Red Yeast Rice Extract (RED YEAST RICE PO) Take by mouth. 1200 mg     SYNTHROID 150 MCG tablet TAKE 1 TABLET DAILY BEFORE BREAKFAST 90 tablet 2   No facility-administered medications prior to visit.    ROS: Review of Systems  Constitutional:  Negative for appetite change, fatigue and unexpected weight change.  HENT:  Negative for congestion, nosebleeds, sneezing, sore throat and trouble swallowing.   Eyes:  Negative for itching and visual disturbance.  Respiratory:  Negative for cough.   Cardiovascular:  Negative for chest pain, palpitations and leg swelling.  Gastrointestinal:  Negative for abdominal distention, blood in stool, diarrhea and nausea.  Genitourinary:  Negative for frequency and hematuria.  Musculoskeletal:  Negative for back pain, gait problem, joint swelling and neck pain.  Skin:  Negative for rash.  Neurological:  Negative for dizziness, tremors, speech difficulty and weakness.  Psychiatric/Behavioral:  Negative for agitation, dysphoric mood and sleep disturbance. The patient is not nervous/anxious.    Objective:  BP 122/78 (BP Location: Left Arm, Patient Position: Sitting, Cuff Size: Normal)    Pulse 69    Temp 98.5 F (36.9 C) (Oral)    Ht 6' (1.829 m)    Wt 197 lb (89.4 kg)    SpO2 97%    BMI 26.72 kg/m   BP Readings from Last 3 Encounters:   03/17/21 122/78  05/19/20 108/74  01/14/20 118/84    Wt Readings from Last 3 Encounters:  03/17/21 197 lb (89.4 kg)  02/03/21 195 lb (88.5 kg)  05/19/20 189 lb (85.7 kg)    Physical Exam Constitutional:      General: He is not in acute distress.    Appearance: He is well-developed.     Comments: NAD  Eyes:     Conjunctiva/sclera: Conjunctivae normal.     Pupils: Pupils are equal, round, and reactive to light.  Neck:     Thyroid: No thyromegaly.     Vascular: No JVD.  Cardiovascular:     Rate and Rhythm: Normal rate and regular rhythm.     Heart sounds: Normal heart sounds. No murmur heard.   No friction rub. No gallop.  Pulmonary:     Effort: Pulmonary effort is normal. No respiratory distress.     Breath sounds: Normal breath sounds. No wheezing or rales.  Chest:     Chest wall: No tenderness.  Abdominal:     General: Bowel sounds are normal. There is no distension.     Palpations: Abdomen is soft. There is no mass.     Tenderness: There is no abdominal tenderness. There is no guarding or rebound.  Musculoskeletal:        General: No tenderness. Normal range of motion.  Cervical back: Normal range of motion.  Lymphadenopathy:     Cervical: No cervical adenopathy.  Skin:    General: Skin is warm and dry.     Findings: No rash.  Neurological:     Mental Status: He is alert and oriented to person, place, and time.     Cranial Nerves: No cranial nerve deficit.     Motor: No abnormal muscle tone.     Coordination: Coordination normal.     Gait: Gait normal.     Deep Tendon Reflexes: Reflexes are normal and symmetric.  Psychiatric:        Behavior: Behavior normal.        Thought Content: Thought content normal.        Judgment: Judgment normal.  Rectal - per GI  Lab Results  Component Value Date   WBC 3.6 (L) 03/09/2021   HGB 13.0 03/09/2021   HCT 40.2 03/09/2021   PLT 235.0 03/09/2021   GLUCOSE 93 03/09/2021   CHOL 179 03/09/2021   TRIG 110.0  03/09/2021   HDL 38.80 (L) 03/09/2021   LDLCALC 118 (H) 03/09/2021   ALT 10 03/09/2021   AST 13 03/09/2021   NA 138 03/09/2021   K 4.0 03/09/2021   CL 103 03/09/2021   CREATININE 1.12 03/09/2021   BUN 13 03/09/2021   CO2 28 03/09/2021   TSH 0.31 (L) 03/09/2021   PSA 1.92 03/09/2021    CT CARDIAC SCORING  Addendum Date: 02/07/2019   ADDENDUM REPORT: 02/07/2019 17:35 CLINICAL DATA:  Risk stratification EXAM: Coronary Calcium Score TECHNIQUE: The patient was scanned on a [employer] scanner. Axial non-contrast 3 mm slices were carried out through the heart. The data set was analyzed on a dedicated work station and scored using the [place]. FINDINGS: Non-cardiac: See separate report from [facility]. Ascending Aorta: Normal Caliber.  Mild Calcifications noted. Pericardium: Normal Coronary arteries: Normal coronary origins. Coronary calcifications in the LM and LAD. IMPRESSION: Coronary calcium score of 4. This was 34th percentile for age and sex matched control. Fransico Him Electronically Signed   By: Fransico Him   On: 02/07/2019 17:35   Result Date: 02/07/2019 EXAM: OVER-READ INTERPRETATION  CT CHEST The following report is an over-read performed by radiologist Dr. Suzy Bouchard of [facility], [place] on 02/07/2019. This over-read does not include interpretation of cardiac or coronary anatomy or pathology. The coronary calcium score interpretation by the cardiologist is attached. COMPARISON:  None. FINDINGS: Limited view of the lung parenchyma demonstrates no suspicious nodularity. Airways are normal. Limited view of the mediastinum demonstrates no adenopathy. Esophagus normal. Limited view of the upper abdomen unremarkable. Limited view of the skeleton and chest wall is unremarkable. IMPRESSION: No significant extracardiac findings. Electronically Signed: By: Suzy Bouchard M.D. On: 02/07/2019 16:47    Assessment & Plan:   Problem List Items Addressed This Visit      Elevated PSA    Nl PSA Will monitor      Hypothyroidism    Abn TSH. Clinically euthyroid. Check FT4 in 4 wks      Well adult exam    We discussed age appropriate health related issues, including available/recomended screening tests and vaccinations. Labs were ordered to be later reviewed . All questions were answered. We discussed one or more of the following - seat belt use, use of sunscreen/sun exposure exercise, safe sex, fall risk reduction, second hand smoke exposure, firearm use and storage, seat belt use, a need for adhering to healthy diet and  exercise. Labs were ordered.  All questions were answered. Colon due 2027, last in 2022      Relevant Orders   TSH   T4, free      No orders of the defined types were placed in this encounter.     Follow-up: No follow-ups on file.  Walker Kehr, MD

## 2021-03-17 NOTE — Assessment & Plan Note (Signed)
Nl PSA Will monitor

## 2021-03-17 NOTE — Assessment & Plan Note (Addendum)
We discussed age appropriate health related issues, including available/recomended screening tests and vaccinations. Labs were ordered to be later reviewed . All questions were answered. We discussed one or more of the following - seat belt use, use of sunscreen/sun exposure exercise, safe sex, fall risk reduction, second hand smoke exposure, firearm use and storage, seat belt use, a need for adhering to healthy diet and exercise. Labs were ordered.  All questions were answered. Colon due 2027, last in 2022

## 2021-05-22 ENCOUNTER — Other Ambulatory Visit (INDEPENDENT_AMBULATORY_CARE_PROVIDER_SITE_OTHER): Payer: BC Managed Care – PPO

## 2021-05-22 DIAGNOSIS — Z0000 Encounter for general adult medical examination without abnormal findings: Secondary | ICD-10-CM

## 2021-05-22 LAB — T4, FREE: Free T4: 0.8 ng/dL (ref 0.60–1.60)

## 2021-05-22 LAB — TSH: TSH: 4.31 u[IU]/mL (ref 0.35–5.50)

## 2021-05-25 ENCOUNTER — Encounter: Payer: Self-pay | Admitting: Internal Medicine

## 2021-05-25 MED ORDER — LEVOTHYROXINE SODIUM 150 MCG PO TABS: 150.0000 ug | ORAL_TABLET | Freq: Every day | ORAL | 3 refills | Status: DC

## 2021-05-25 NOTE — Telephone Encounter (Signed)
Rx sent 

## 2021-05-27 ENCOUNTER — Other Ambulatory Visit: Payer: Self-pay | Admitting: Internal Medicine

## 2022-02-09 ENCOUNTER — Ambulatory Visit: Payer: BC Managed Care – PPO | Admitting: Internal Medicine

## 2022-02-09 ENCOUNTER — Ambulatory Visit (INDEPENDENT_AMBULATORY_CARE_PROVIDER_SITE_OTHER): Payer: BC Managed Care – PPO

## 2022-02-09 ENCOUNTER — Encounter: Payer: Self-pay | Admitting: Internal Medicine

## 2022-02-09 VITALS — BP 158/88 | HR 87 | Temp 98.8°F | Ht 72.0 in | Wt 195.8 lb

## 2022-02-09 DIAGNOSIS — J189 Pneumonia, unspecified organism: Secondary | ICD-10-CM | POA: Diagnosis not present

## 2022-02-09 DIAGNOSIS — R051 Acute cough: Secondary | ICD-10-CM | POA: Diagnosis not present

## 2022-02-09 MED ORDER — PROMETHAZINE-DM 6.25-15 MG/5ML PO SYRP: 5.0000 mL | ORAL_SOLUTION | Freq: Four times a day (QID) | ORAL | 0 refills | Status: AC | PRN

## 2022-02-09 MED ORDER — LEVOFLOXACIN 500 MG PO TABS: 500.0000 mg | ORAL_TABLET | Freq: Every day | ORAL | 0 refills | Status: AC

## 2022-02-09 NOTE — Assessment & Plan Note (Addendum)
?  LLL CAP Get a CXR Start Levaquin x 10 d Prom cough syringe  Potential benefits of quinolone abx use as well as potential risks  and complications were explained to the patient and were aknowledged.

## 2022-02-09 NOTE — Progress Notes (Signed)
Subjective:  Patient ID: Derrick Walters, male    DOB: 26-Oct-1960  Age: 61 y.o. MRN: [id number]  CC: Cough (Pt states he is coughing up phlegm. Did have a virtual visit was rx amoxillian which he finish. Want a flu shot if he can have) and Nasal Congestion   HPI Derrick Walters presents for being sick since Thanksgiving. C/o sinus congestion is yellow in-he took amoxicillin it helped, then sx's came back.   Outpatient Medications Prior to Visit  Medication Sig Dispense Refill   acyclovir (ZOVIRAX) 200 MG capsule Take 200 mg by mouth daily.     Ascorbic Acid (VITAMIN C) 1000 MG tablet Take 1,000 mg by mouth daily.     Cholecalciferol 1000 UNITS tablet Take 1,000 Units by mouth daily.     Flaxseed, Linseed, (FLAX SEEDS PO) Take 1 tablet by mouth daily.      levothyroxine (SYNTHROID) 150 MCG tablet Take 1 tablet (150 mcg total) by mouth daily before breakfast. 90 tablet 3   Red Yeast Rice Extract (RED YEAST RICE PO) Take by mouth. 1200 mg     doxycycline (VIBRA-TABS) 100 MG tablet Take 1 tablet (100 mg total) by mouth 2 (two) times daily. (Patient not taking: Reported on 02/09/2022) 20 tablet 0   No facility-administered medications prior to visit.    ROS: Review of Systems  Constitutional:  Positive for fatigue. Negative for appetite change and unexpected weight change.  HENT:  Positive for congestion. Negative for nosebleeds, sneezing, sore throat and trouble swallowing.   Eyes:  Negative for itching and visual disturbance.  Respiratory:  Positive for cough and shortness of breath.   Cardiovascular:  Negative for chest pain, palpitations and leg swelling.  Gastrointestinal:  Negative for abdominal distention, blood in stool, diarrhea and nausea.  Genitourinary:  Negative for frequency and hematuria.  Musculoskeletal:  Negative for back pain, gait problem, joint swelling and neck pain.  Skin:  Negative for rash.  Neurological:  Negative for dizziness, tremors, speech difficulty and  weakness.  Psychiatric/Behavioral:  Negative for agitation, dysphoric mood and sleep disturbance. The patient is not nervous/anxious.     Objective:  BP (!) 158/88 (BP Location: Left Arm)   Pulse 87   Temp 98.8 F (37.1 C) (Oral)   Ht 6' (1.829 m)   Wt 195 lb 12.8 oz (88.8 kg)   SpO2 97%   BMI 26.56 kg/m   BP Readings from Last 3 Encounters:  02/09/22 (!) 158/88  03/17/21 122/78  05/19/20 108/74    Wt Readings from Last 3 Encounters:  02/09/22 195 lb 12.8 oz (88.8 kg)  03/17/21 197 lb (89.4 kg)  02/03/21 195 lb (88.5 kg)    Physical Exam Constitutional:      General: He is not in acute distress.    Appearance: Normal appearance. He is well-developed.     Comments: NAD  Eyes:     Conjunctiva/sclera: Conjunctivae normal.     Pupils: Pupils are equal, round, and reactive to light.  Neck:     Thyroid: No thyromegaly.     Vascular: No JVD.  Cardiovascular:     Rate and Rhythm: Normal rate and regular rhythm.     Heart sounds: Normal heart sounds. No murmur heard.    No friction rub. No gallop.  Pulmonary:     Effort: Pulmonary effort is normal. No respiratory distress.     Breath sounds: Rhonchi and rales present. No wheezing.  Chest:     Chest wall: No tenderness.  Abdominal:     General: Bowel sounds are normal. There is no distension.     Palpations: Abdomen is soft. There is no mass.     Tenderness: There is no abdominal tenderness. There is no guarding or rebound.  Musculoskeletal:        General: No tenderness. Normal range of motion.     Cervical back: Normal range of motion.  Lymphadenopathy:     Cervical: No cervical adenopathy.  Skin:    General: Skin is warm and dry.     Findings: No rash.  Neurological:     Mental Status: He is alert and oriented to person, place, and time.     Cranial Nerves: No cranial nerve deficit.     Motor: No abnormal muscle tone.     Coordination: Coordination normal.     Gait: Gait normal.     Deep Tendon Reflexes:  Reflexes are normal and symmetric.  Psychiatric:        Behavior: Behavior normal.        Thought Content: Thought content normal.        Judgment: Judgment normal.   Rhonchi and rales on the L  Lab Results  Component Value Date   WBC 3.6 (L) 03/09/2021   HGB 13.0 03/09/2021   HCT 40.2 03/09/2021   PLT 235.0 03/09/2021   GLUCOSE 93 03/09/2021   CHOL 179 03/09/2021   TRIG 110.0 03/09/2021   HDL 38.80 (L) 03/09/2021   LDLCALC 118 (H) 03/09/2021   ALT 10 03/09/2021   AST 13 03/09/2021   NA 138 03/09/2021   K 4.0 03/09/2021   CL 103 03/09/2021   CREATININE 1.12 03/09/2021   BUN 13 03/09/2021   CO2 28 03/09/2021   TSH 4.31 05/22/2021   PSA 1.92 03/09/2021    CT CARDIAC SCORING  Addendum Date: 02/07/2019   ADDENDUM REPORT: 02/07/2019 17:35 CLINICAL DATA:  Risk stratification EXAM: Coronary Calcium Score TECHNIQUE: The patient was scanned on a [employer] scanner. Axial non-contrast 3 mm slices were carried out through the heart. The data set was analyzed on a dedicated work station and scored using the [place]. FINDINGS: Non-cardiac: See separate report from [facility]. Ascending Aorta: Normal Caliber.  Mild Calcifications noted. Pericardium: Normal Coronary arteries: Normal coronary origins. Coronary calcifications in the LM and LAD. IMPRESSION: Coronary calcium score of 4. This was 34th percentile for age and sex matched control. Fransico Him Electronically Signed   By: Fransico Him   On: 02/07/2019 17:35   Result Date: 02/07/2019 EXAM: OVER-READ INTERPRETATION  CT CHEST The following report is an over-read performed by radiologist Dr. Suzy Bouchard of [facility], [place] on 02/07/2019. This over-read does not include interpretation of cardiac or coronary anatomy or pathology. The coronary calcium score interpretation by the cardiologist is attached. COMPARISON:  None. FINDINGS: Limited view of the lung parenchyma demonstrates no suspicious nodularity. Airways  are normal. Limited view of the mediastinum demonstrates no adenopathy. Esophagus normal. Limited view of the upper abdomen unremarkable. Limited view of the skeleton and chest wall is unremarkable. IMPRESSION: No significant extracardiac findings. Electronically Signed: By: Suzy Bouchard M.D. On: 02/07/2019 16:47    Assessment & Plan:   Problem List Items Addressed This Visit     CAP (community acquired pneumonia)    ?LLL CAP Get a CXR Start Levaquin x 10 d Prom cough syringe  Potential benefits of quinolone abx use as well as potential risks  and complications were explained to the patient  and were aknowledged.       Relevant Medications   levofloxacin (LEVAQUIN) 500 MG tablet   promethazine-dextromethorphan (PROMETHAZINE-DM) 6.25-15 MG/5ML syrup   Other Visit Diagnoses     Acute cough    -  Primary   Relevant Orders   DG Chest 2 View         Meds ordered this encounter  Medications   levofloxacin (LEVAQUIN) 500 MG tablet    Sig: Take 1 tablet (500 mg total) by mouth daily.    Dispense:  10 tablet    Refill:  0   promethazine-dextromethorphan (PROMETHAZINE-DM) 6.25-15 MG/5ML syrup    Sig: Take 5 mLs by mouth 4 (four) times daily as needed for cough.    Dispense:  240 mL    Refill:  0      Follow-up: Return for a follow-up visit.  Walker Kehr, MD

## 2022-02-16 ENCOUNTER — Ambulatory Visit: Payer: BC Managed Care – PPO | Admitting: Internal Medicine

## 2022-03-03 ENCOUNTER — Encounter: Payer: Self-pay | Admitting: Internal Medicine

## 2022-03-03 DIAGNOSIS — E785 Hyperlipidemia, unspecified: Secondary | ICD-10-CM

## 2022-03-03 DIAGNOSIS — Z0000 Encounter for general adult medical examination without abnormal findings: Secondary | ICD-10-CM

## 2022-03-03 DIAGNOSIS — E034 Atrophy of thyroid (acquired): Secondary | ICD-10-CM

## 2022-03-03 DIAGNOSIS — R972 Elevated prostate specific antigen [PSA]: Secondary | ICD-10-CM

## 2022-03-16 ENCOUNTER — Other Ambulatory Visit: Payer: BC Managed Care – PPO

## 2022-03-16 ENCOUNTER — Other Ambulatory Visit (INDEPENDENT_AMBULATORY_CARE_PROVIDER_SITE_OTHER): Payer: BC Managed Care – PPO

## 2022-03-16 DIAGNOSIS — E034 Atrophy of thyroid (acquired): Secondary | ICD-10-CM

## 2022-03-16 DIAGNOSIS — R972 Elevated prostate specific antigen [PSA]: Secondary | ICD-10-CM

## 2022-03-16 DIAGNOSIS — Z0000 Encounter for general adult medical examination without abnormal findings: Secondary | ICD-10-CM | POA: Diagnosis not present

## 2022-03-16 DIAGNOSIS — E785 Hyperlipidemia, unspecified: Secondary | ICD-10-CM | POA: Diagnosis not present

## 2022-03-16 LAB — COMPREHENSIVE METABOLIC PANEL
ALT: 14 U/L (ref 0–53)
AST: 17 U/L (ref 0–37)
Albumin: 4.4 g/dL (ref 3.5–5.2)
Alkaline Phosphatase: 87 U/L (ref 39–117)
BUN: 19 mg/dL (ref 6–23)
CO2: 28 mEq/L (ref 19–32)
Calcium: 9.7 mg/dL (ref 8.4–10.5)
Chloride: 102 mEq/L (ref 96–112)
Creatinine, Ser: 1.27 mg/dL (ref 0.40–1.50)
GFR: 60.24 mL/min (ref >60.00)
Glucose, Bld: 95 mg/dL (ref 70–99)
Potassium: 4 mEq/L (ref 3.5–5.1)
Sodium: 139 mEq/L (ref 135–145)
Total Bilirubin: 0.7 mg/dL (ref 0.2–1.2)
Total Protein: 7.7 g/dL (ref 6.0–8.3)

## 2022-03-16 LAB — CBC WITH DIFFERENTIAL/PLATELET
Basophils Absolute: 0.1 10*3/uL (ref 0.0–0.1)
Basophils Relative: 0.9 % (ref 0.0–3.0)
Eosinophils Absolute: 0.2 10*3/uL (ref 0.0–0.7)
Eosinophils Relative: 3.8 % (ref 0.0–5.0)
HCT: 40.8 % (ref 39.0–52.0)
Hemoglobin: 14.2 g/dL (ref 13.0–17.0)
Lymphocytes Relative: 24.8 % (ref 12.0–46.0)
Lymphs Abs: 1.5 10*3/uL (ref 0.7–4.0)
MCHC: 34.7 g/dL (ref 30.0–36.0)
MCV: 93.8 fl (ref 78.0–100.0)
Monocytes Absolute: 0.3 10*3/uL (ref 0.1–1.0)
Monocytes Relative: 5.9 % (ref 3.0–12.0)
Neutro Abs: 3.8 10*3/uL (ref 1.4–7.7)
Neutrophils Relative %: 64.6 % (ref 43.0–77.0)
Platelets: 241 10*3/uL (ref 150.0–400.0)
RBC: 4.36 Mil/uL (ref 4.22–5.81)
RDW: 13.5 % (ref 11.5–15.5)
WBC: 5.9 10*3/uL (ref 4.0–10.5)

## 2022-03-16 LAB — URINALYSIS, ROUTINE W REFLEX MICROSCOPIC
Bilirubin Urine: NEGATIVE
Hgb urine dipstick: NEGATIVE
Ketones, ur: NEGATIVE
Leukocytes,Ua: NEGATIVE
Nitrite: NEGATIVE
RBC / HPF: NONE SEEN (ref 0–?)
Specific Gravity, Urine: 1.02 (ref 1.000–1.030)
Total Protein, Urine: NEGATIVE
Urine Glucose: NEGATIVE
Urobilinogen, UA: 0.2 (ref 0.0–1.0)
WBC, UA: NONE SEEN (ref 0–?)
pH: 6 (ref 5.0–8.0)

## 2022-03-16 LAB — TSH: TSH: 6.64 u[IU]/mL — ABNORMAL HIGH (ref 0.35–5.50)

## 2022-03-16 LAB — LIPID PANEL
Cholesterol: 171 mg/dL (ref 0–200)
HDL: 36.8 mg/dL — ABNORMAL LOW (ref >39.00)
LDL Cholesterol: 112 mg/dL — ABNORMAL HIGH (ref 0–99)
NonHDL: 134.66
Total CHOL/HDL Ratio: 5
Triglycerides: 114 mg/dL (ref 0.0–149.0)
VLDL: 22.8 mg/dL (ref 0.0–40.0)

## 2022-03-16 LAB — PSA: PSA: 2.24 ng/mL (ref 0.10–4.00)

## 2022-03-18 ENCOUNTER — Ambulatory Visit (INDEPENDENT_AMBULATORY_CARE_PROVIDER_SITE_OTHER): Payer: BC Managed Care – PPO | Admitting: Internal Medicine

## 2022-03-18 ENCOUNTER — Encounter: Payer: Self-pay | Admitting: Internal Medicine

## 2022-03-18 VITALS — BP 140/90 | HR 65 | Temp 97.8°F | Ht 73.0 in | Wt 203.6 lb

## 2022-03-18 DIAGNOSIS — E034 Atrophy of thyroid (acquired): Secondary | ICD-10-CM | POA: Diagnosis not present

## 2022-03-18 DIAGNOSIS — Z23 Encounter for immunization: Secondary | ICD-10-CM

## 2022-03-18 DIAGNOSIS — Z0000 Encounter for general adult medical examination without abnormal findings: Secondary | ICD-10-CM | POA: Diagnosis not present

## 2022-03-18 DIAGNOSIS — J189 Pneumonia, unspecified organism: Secondary | ICD-10-CM

## 2022-03-18 NOTE — Progress Notes (Signed)
Subjective:  Patient ID: Derrick Walters, male    DOB: [birth year]  Age: 62 y.o. MRN: [id number]  CC: Annual Exam (Physical ) and Follow-up (F/u on pneumonia.)   HPI Derrick Walters presents for a well exam F/u CAP Engaged 2023, getting married in Aug 2024.    Outpatient Medications Prior to Visit  Medication Sig Dispense Refill   acyclovir (ZOVIRAX) 200 MG capsule Take 200 mg by mouth daily.     Ascorbic Acid (VITAMIN C) 1000 MG tablet Take 1,000 mg by mouth daily.     Cholecalciferol 1000 UNITS tablet Take 1,000 Units by mouth daily.     Flaxseed, Linseed, (FLAX SEEDS PO) Take 1 tablet by mouth daily.      levofloxacin (LEVAQUIN) 500 MG tablet Take 1 tablet (500 mg total) by mouth daily. 10 tablet 0   levothyroxine (SYNTHROID) 150 MCG tablet Take 1 tablet (150 mcg total) by mouth daily before breakfast. 90 tablet 3   promethazine-dextromethorphan (PROMETHAZINE-DM) 6.25-15 MG/5ML syrup Take 5 mLs by mouth 4 (four) times daily as needed for cough. 240 mL 0   Red Yeast Rice Extract (RED YEAST RICE PO) Take by mouth. 1200 mg     No facility-administered medications prior to visit.    ROS: Review of Systems  Constitutional:  Negative for appetite change, fatigue and unexpected weight change.  HENT:  Negative for congestion, nosebleeds, sneezing, sore throat and trouble swallowing.   Eyes:  Negative for itching and visual disturbance.  Respiratory:  Negative for cough.   Cardiovascular:  Negative for chest pain, palpitations and leg swelling.  Gastrointestinal:  Negative for abdominal distention, blood in stool, diarrhea and nausea.  Genitourinary:  Negative for frequency and hematuria.  Musculoskeletal:  Negative for back pain, gait problem, joint swelling and neck pain.  Skin:  Negative for rash.  Neurological:  Negative for dizziness, tremors, speech difficulty and weakness.  Psychiatric/Behavioral:  Negative for agitation, dysphoric mood and sleep disturbance. The patient is not  nervous/anxious.     Objective:  BP (!) 140/90 (BP Location: Left Arm, Patient Position: Sitting, Cuff Size: Large)   Pulse 65   Temp 97.8 F (36.6 C) (Oral)   Ht '6\' 1"'$  (1.854 m)   Wt 203 lb 9.6 oz (92.4 kg)   SpO2 97%   BMI 26.86 kg/m   BP Readings from Last 3 Encounters:  03/18/22 (!) 140/90  02/09/22 (!) 158/88  03/17/21 122/78    Wt Readings from Last 3 Encounters:  03/18/22 203 lb 9.6 oz (92.4 kg)  02/09/22 195 lb 12.8 oz (88.8 kg)  03/17/21 197 lb (89.4 kg)    Physical Exam Constitutional:      General: He is not in acute distress.    Appearance: He is well-developed.     Comments: NAD  Eyes:     Conjunctiva/sclera: Conjunctivae normal.     Pupils: Pupils are equal, round, and reactive to light.  Neck:     Thyroid: No thyromegaly.     Vascular: No JVD.  Cardiovascular:     Rate and Rhythm: Normal rate and regular rhythm.     Heart sounds: Normal heart sounds. No murmur heard.    No friction rub. No gallop.  Pulmonary:     Effort: Pulmonary effort is normal. No respiratory distress.     Breath sounds: Normal breath sounds. No wheezing or rales.  Chest:     Chest wall: No tenderness.  Abdominal:     General: Bowel sounds are normal.  There is no distension.     Palpations: Abdomen is soft. There is no mass.     Tenderness: There is no abdominal tenderness. There is no guarding or rebound.  Musculoskeletal:        General: No tenderness. Normal range of motion.     Cervical back: Normal range of motion.  Lymphadenopathy:     Cervical: No cervical adenopathy.  Skin:    General: Skin is warm and dry.     Findings: No rash.  Neurological:     Mental Status: He is alert and oriented to person, place, and time.     Cranial Nerves: No cranial nerve deficit.     Motor: No abnormal muscle tone.     Coordination: Coordination normal.     Gait: Gait normal.     Deep Tendon Reflexes: Reflexes are normal and symmetric.  Psychiatric:        Behavior: Behavior  normal.        Thought Content: Thought content normal.        Judgment: Judgment normal.   Pt declined rectal  Lab Results  Component Value Date   WBC 5.9 03/16/2022   HGB 14.2 03/16/2022   HCT 40.8 03/16/2022   PLT 241.0 03/16/2022   GLUCOSE 95 03/16/2022   CHOL 171 03/16/2022   TRIG 114.0 03/16/2022   HDL 36.80 (L) 03/16/2022   LDLCALC 112 (H) 03/16/2022   ALT 14 03/16/2022   AST 17 03/16/2022   NA 139 03/16/2022   K 4.0 03/16/2022   CL 102 03/16/2022   CREATININE 1.27 03/16/2022   BUN 19 03/16/2022   CO2 28 03/16/2022   TSH 6.64 (H) 03/16/2022   PSA 2.24 03/16/2022    CT CARDIAC SCORING  Addendum Date: 02/07/2019   ADDENDUM REPORT: 02/07/2019 17:35 CLINICAL DATA:  Risk stratification EXAM: Coronary Calcium Score TECHNIQUE: The patient was scanned on a [employer] scanner. Axial non-contrast 3 mm slices were carried out through the heart. The data set was analyzed on a dedicated work station and scored using the [place]. FINDINGS: Non-cardiac: See separate report from [facility]. Ascending Aorta: Normal Caliber.  Mild Calcifications noted. Pericardium: Normal Coronary arteries: Normal coronary origins. Coronary calcifications in the LM and LAD. IMPRESSION: Coronary calcium score of 4. This was 34th percentile for age and sex matched control. Fransico Him Electronically Signed   By: Fransico Him   On: 02/07/2019 17:35   Result Date: 02/07/2019 EXAM: OVER-READ INTERPRETATION  CT CHEST The following report is an over-read performed by radiologist Dr. Suzy Bouchard of [facility], [place] on 02/07/2019. This over-read does not include interpretation of cardiac or coronary anatomy or pathology. The coronary calcium score interpretation by the cardiologist is attached. COMPARISON:  None. FINDINGS: Limited view of the lung parenchyma demonstrates no suspicious nodularity. Airways are normal. Limited view of the mediastinum demonstrates no adenopathy. Esophagus  normal. Limited view of the upper abdomen unremarkable. Limited view of the skeleton and chest wall is unremarkable. IMPRESSION: No significant extracardiac findings. Electronically Signed: By: Suzy Bouchard M.D. On: 02/07/2019 16:47    Assessment & Plan:   Problem List Items Addressed This Visit       Respiratory   CAP (community acquired pneumonia)    Repeat CXR in 4 wks      Relevant Orders   DG Chest 2 View     Endocrine   Hypothyroidism    On Levothyroxine        Other  Well adult exam - Primary     We discussed age appropriate health related issues, including available/recomended screening tests and vaccinations. Labs were ordered to be later reviewed . All questions were answered. We discussed one or more of the following - seat belt use, use of sunscreen/sun exposure exercise, safe sex, fall risk reduction, second hand smoke exposure, firearm use and storage, seat belt use, a need for adhering to healthy diet and exercise. Labs were ordered.  All questions were answered. Colon due 2027, last in 2022 Shingrix 2020         No orders of the defined types were placed in this encounter.     Follow-up: Return for a follow-up visit.  Walker Kehr, MD

## 2022-03-18 NOTE — Assessment & Plan Note (Signed)
On Levothyroxine 

## 2022-03-18 NOTE — Assessment & Plan Note (Signed)
  We discussed age appropriate health related issues, including available/recomended screening tests and vaccinations. Labs were ordered to be later reviewed . All questions were answered. We discussed one or more of the following - seat belt use, use of sunscreen/sun exposure exercise, safe sex, fall risk reduction, second hand smoke exposure, firearm use and storage, seat belt use, a need for adhering to healthy diet and exercise. Labs were ordered.  All questions were answered. Colon due 2027, last in 2022 Shingrix 2020

## 2022-03-18 NOTE — Assessment & Plan Note (Signed)
Repeat CXR in 4 wks

## 2022-03-18 NOTE — Addendum Note (Signed)
Addended by: Marijean Heath R on: 03/18/2022 10:12 AM   Modules accepted: Orders

## 2022-04-13 ENCOUNTER — Ambulatory Visit (INDEPENDENT_AMBULATORY_CARE_PROVIDER_SITE_OTHER): Payer: BC Managed Care – PPO

## 2022-04-13 DIAGNOSIS — J189 Pneumonia, unspecified organism: Secondary | ICD-10-CM | POA: Diagnosis not present

## 2022-04-18 ENCOUNTER — Encounter: Payer: Self-pay | Admitting: Internal Medicine

## 2022-04-20 ENCOUNTER — Other Ambulatory Visit: Payer: Self-pay | Admitting: Internal Medicine

## 2022-04-20 DIAGNOSIS — R051 Acute cough: Secondary | ICD-10-CM

## 2022-04-20 DIAGNOSIS — J189 Pneumonia, unspecified organism: Secondary | ICD-10-CM

## 2022-04-20 DIAGNOSIS — R918 Other nonspecific abnormal finding of lung field: Secondary | ICD-10-CM

## 2022-05-16 ENCOUNTER — Other Ambulatory Visit: Payer: Self-pay | Admitting: Internal Medicine

## 2022-05-19 ENCOUNTER — Ambulatory Visit
Admission: RE | Admit: 2022-05-19 | Discharge: 2022-05-19 | Disposition: A | Discharge status: Home / Self Care | Payer: BC Managed Care – PPO | Source: Ambulatory Visit | Attending: Internal Medicine | Admitting: Internal Medicine

## 2022-05-19 DIAGNOSIS — R051 Acute cough: Secondary | ICD-10-CM

## 2022-05-19 DIAGNOSIS — J189 Pneumonia, unspecified organism: Secondary | ICD-10-CM

## 2022-05-20 NOTE — Addendum Note (Signed)
Addended by: Cassandria Anger on: 05/20/2022 11:50 PM   Modules accepted: Orders

## 2022-06-24 ENCOUNTER — Encounter: Payer: Self-pay | Admitting: Pulmonary Disease

## 2022-06-24 ENCOUNTER — Ambulatory Visit: Payer: BC Managed Care – PPO | Admitting: Pulmonary Disease

## 2022-06-24 VITALS — BP 130/80 | HR 64 | Ht 72.0 in | Wt 201.4 lb

## 2022-06-24 DIAGNOSIS — J189 Pneumonia, unspecified organism: Secondary | ICD-10-CM

## 2022-06-24 NOTE — Progress Notes (Signed)
Synopsis: Referred in April 2024 pneumonia by Derrick Walters, Derrick Quint, MD  Subjective:   PATIENT ID: Derrick Walters: male DOB: Jul 25, 1960, MRN: [id number]  Chief Complaint  Patient presents with   Consult    Here for pneu.    This is a 62 year old gentleman, past medical history of hyperlipidemia, hypothyroidism.  Patient was referred for evaluation after abnormal CT imaging with persistent infiltrates in the left base.  Patient was diagnosed with community-acquired pneumonia back in December.  He had follow-up CT imaging which shows persistent abnormality in the left base with infiltrate and scarring.  Overall patient is clinically has been improving.  He went on a 10 mile hike last week.  He is a lifelong non-smoker.    Past Medical History:  Diagnosis Date   Anemia    Blood donor    q 2-3 mo   History of nephrolithiasis    Hx of adenomatous colonic polyps 05/19/2020   Hyperlipidemia    Hypothyroidism      Family History  Problem Relation Age of Onset   Depression Father        committed suicide at age 12   Suicidality Father    COPD Mother    Lung cancer Mother    Depression Brother    Colon cancer Neg Hx    Colon polyps Neg Hx    Esophageal cancer Neg Hx    Rectal cancer Neg Hx    Stomach cancer Neg Hx      Past Surgical History:  Procedure Laterality Date   COLONOSCOPY  2011   TONSILLECTOMY      Social History   Socioeconomic History   Marital status: Divorced    Spouse name: Not on file   Number of children: 0   Years of education: Not on file   Highest education level: Not on file  Occupational History   Occupation: help desk  Tobacco Use   Smoking status: Never   Smokeless tobacco: Never  Vaping Use   Vaping Use: Never used  Substance and Sexual Activity   Alcohol use: Yes    Alcohol/week: 1.0 standard drink of alcohol    Types: 1 Cans of beer per week    Comment: less than 1 per day   Drug use: No   Sexual activity: Yes  Other  Topics Concern   Not on file  Social History Narrative   Regular exercise-yes, tennis and running   Social Determinants of Health   Financial Resource Strain: Not on file  Food Insecurity: Not on file  Transportation Needs: Not on file  Physical Activity: Not on file  Stress: Not on file  Social Connections: Not on file  Intimate Partner Violence: Not on file     No Known Allergies   Outpatient Medications Prior to Visit  Medication Sig Dispense Refill   acyclovir (ZOVIRAX) 200 MG capsule Take 200 mg by mouth daily.     Ascorbic Acid (VITAMIN C) 1000 MG tablet Take 1,000 mg by mouth daily.     Cholecalciferol 1000 UNITS tablet Take 1,000 Units by mouth daily.     Flaxseed, Linseed, (FLAX SEEDS PO) Take 1 tablet by mouth daily.      levothyroxine (SYNTHROID) 150 MCG tablet TAKE 1 TABLET BY MOUTH DAILY BEFORE BREAKFAST. 90 tablet 3   Red Yeast Rice Extract (RED YEAST RICE PO) Take by mouth. 1200 mg     levofloxacin (LEVAQUIN) 500 MG tablet Take 1 tablet (500 mg total) by mouth  daily. (Patient not taking: Reported on 06/24/2022) 10 tablet 0   promethazine-dextromethorphan (PROMETHAZINE-DM) 6.25-15 MG/5ML syrup Take 5 mLs by mouth 4 (four) times daily as needed for cough. (Patient not taking: Reported on 06/24/2022) 240 mL 0   No facility-administered medications prior to visit.    Review of Systems  Constitutional:  Negative for chills, fever, malaise/fatigue and weight loss.  HENT:  Negative for hearing loss, sore throat and tinnitus.   Eyes:  Negative for blurred vision and double vision.  Respiratory:  Negative for cough, hemoptysis, sputum production, shortness of breath, wheezing and stridor.   Cardiovascular:  Negative for chest pain, palpitations, orthopnea, leg swelling and PND.  Gastrointestinal:  Negative for abdominal pain, constipation, diarrhea, heartburn, nausea and vomiting.  Genitourinary:  Negative for dysuria, hematuria and urgency.  Musculoskeletal:  Negative for  joint pain and myalgias.  Skin:  Negative for itching and rash.  Neurological:  Negative for dizziness, tingling, weakness and headaches.  Endo/Heme/Allergies:  Negative for environmental allergies. Does not bruise/bleed easily.  Psychiatric/Behavioral:  Negative for depression. The patient is not nervous/anxious and does not have insomnia.   All other systems reviewed and are negative.    Objective:  Physical Exam Vitals reviewed.  Constitutional:      General: He is not in acute distress.    Appearance: He is well-developed.  HENT:     Head: Normocephalic and atraumatic.  Eyes:     General: No scleral icterus.    Conjunctiva/sclera: Conjunctivae normal.     Pupils: Pupils are equal, round, and reactive to light.  Neck:     Vascular: No JVD.     Trachea: No tracheal deviation.  Cardiovascular:     Rate and Rhythm: Normal rate and regular rhythm.     Heart sounds: Normal heart sounds. No murmur heard. Pulmonary:     Effort: Pulmonary effort is normal. No tachypnea, accessory muscle usage or respiratory distress.     Breath sounds: No stridor. No wheezing, rhonchi or rales.  Abdominal:     General: There is no distension.     Palpations: Abdomen is soft.     Tenderness: There is no abdominal tenderness.  Musculoskeletal:        General: No tenderness.     Cervical back: Neck supple.  Lymphadenopathy:     Cervical: No cervical adenopathy.  Skin:    General: Skin is warm and dry.     Capillary Refill: Capillary refill takes less than 2 seconds.     Findings: No rash.  Neurological:     Mental Status: He is alert and oriented to person, place, and time.  Psychiatric:        Behavior: Behavior normal.      Vitals:   06/24/22 0858  BP: 130/80  Pulse: 64  SpO2: 98%  Weight: 201 lb 6.4 oz (91.4 kg)  Height: 6' (1.829 m)   98% on RA BMI Readings from Last 3 Encounters:  06/24/22 27.31 kg/m  03/18/22 26.86 kg/m  02/09/22 26.56 kg/m   Wt Readings from Last 3  Encounters:  06/24/22 201 lb 6.4 oz (91.4 kg)  03/18/22 203 lb 9.6 oz (92.4 kg)  02/09/22 195 lb 12.8 oz (88.8 kg)     CBC    Component Value Date/Time   WBC 5.9 03/16/2022 0831   RBC 4.36 03/16/2022 0831   HGB 14.2 03/16/2022 0831   HCT 40.8 03/16/2022 0831   PLT 241.0 03/16/2022 0831   MCV 93.8 03/16/2022 0831  MCH 26.6 05/20/2018 0623   MCHC 34.7 03/16/2022 0831   RDW 13.5 03/16/2022 0831   LYMPHSABS 1.5 03/16/2022 0831   MONOABS 0.3 03/16/2022 0831   EOSABS 0.2 03/16/2022 0831   BASOSABS 0.1 03/16/2022 0831     Chest Imaging:  March 2024 HRCT imaging: Irregular groundglass consolidation within the left lung base.  Consistent with previous pneumonia. The patient's images have been independently reviewed by me.    Pulmonary Functions Testing Results:     No data to display          FeNO:   Pathology:   Echocardiogram:   Heart Catheterization:     Assessment & Plan:     ICD-10-CM   1. Community acquired pneumonia of left lower lobe of lung  J18.9 CT Chest Wo Contrast      Discussion:  62 year old gentleman left lower lobe community-acquired pneumonia, symptoms currently improved.  No longer experiencing respiratory symptomatology has abnormal CT imaging for follow-up that showed persistent abnormality in the left base.  Plan: We need to continue to evaluate this left base infiltrate to ensure that complete resolution. Repeat noncontrast CT chest in 3 months. If the lesion is still persistent would consider next options for evaluation to include PET scan or consideration for bronchoscopy. Patient is agreeable to this plan. Follow-up with APP after CT chest complete in 3 months.   Current Outpatient Medications:    acyclovir (ZOVIRAX) 200 MG capsule, Take 200 mg by mouth daily., Disp: , Rfl:    Ascorbic Acid (VITAMIN C) 1000 MG tablet, Take 1,000 mg by mouth daily., Disp: , Rfl:    Cholecalciferol 1000 UNITS tablet, Take 1,000 Units by mouth  daily., Disp: , Rfl:    Flaxseed, Linseed, (FLAX SEEDS PO), Take 1 tablet by mouth daily. , Disp: , Rfl:    levothyroxine (SYNTHROID) 150 MCG tablet, TAKE 1 TABLET BY MOUTH DAILY BEFORE BREAKFAST., Disp: 90 tablet, Rfl: 3   Red Yeast Rice Extract (RED YEAST RICE PO), Take by mouth. 1200 mg, Disp: , Rfl:    levofloxacin (LEVAQUIN) 500 MG tablet, Take 1 tablet (500 mg total) by mouth daily. (Patient not taking: Reported on 06/24/2022), Disp: 10 tablet, Rfl: 0   promethazine-dextromethorphan (PROMETHAZINE-DM) 6.25-15 MG/5ML syrup, Take 5 mLs by mouth 4 (four) times daily as needed for cough. (Patient not taking: Reported on 06/24/2022), Disp: 240 mL, Rfl: 0   Josephine Igo, DO [facility] 06/24/2022 9:35 AM

## 2022-06-24 NOTE — Patient Instructions (Signed)
Thank you for visiting Dr. Tonia Brooms at [facility]. Today we recommend the following:  Orders Placed This Encounter  Procedures   CT Chest Wo Contrast   Return in about 3 months (around 09/23/2022) for with APP.    Please do your part to reduce the spread of COVID-19.

## 2022-08-20 ENCOUNTER — Encounter: Payer: Self-pay | Admitting: Internal Medicine

## 2022-08-23 ENCOUNTER — Other Ambulatory Visit: Payer: Self-pay | Admitting: Internal Medicine

## 2022-08-23 DIAGNOSIS — E034 Atrophy of thyroid (acquired): Secondary | ICD-10-CM

## 2022-09-10 ENCOUNTER — Other Ambulatory Visit (INDEPENDENT_AMBULATORY_CARE_PROVIDER_SITE_OTHER): Payer: BC Managed Care – PPO

## 2022-09-10 DIAGNOSIS — E034 Atrophy of thyroid (acquired): Secondary | ICD-10-CM

## 2022-09-10 LAB — T4, FREE: Free T4: 0.81 ng/dL (ref 0.60–1.60)

## 2022-09-10 LAB — TSH: TSH: 6.4 u[IU]/mL — ABNORMAL HIGH (ref 0.35–5.50)

## 2022-09-16 ENCOUNTER — Ambulatory Visit (HOSPITAL_BASED_OUTPATIENT_CLINIC_OR_DEPARTMENT_OTHER): Payer: BC Managed Care – PPO

## 2022-09-23 ENCOUNTER — Ambulatory Visit: Payer: BC Managed Care – PPO | Admitting: Nurse Practitioner

## 2023-04-14 ENCOUNTER — Encounter: Payer: Self-pay | Admitting: Internal Medicine

## 2023-04-14 DIAGNOSIS — Z0000 Encounter for general adult medical examination without abnormal findings: Secondary | ICD-10-CM

## 2023-04-19 NOTE — Telephone Encounter (Signed)
Pt has asked " FYI... Due to upcoming possible weather, I have rescheduled my Physical to Friday 3/7.  Can I come in earlier that week and have my blood work done?    Thanks,   Derrick Walters"

## 2023-04-22 ENCOUNTER — Encounter: Payer: Self-pay | Admitting: Internal Medicine

## 2023-05-02 ENCOUNTER — Other Ambulatory Visit (INDEPENDENT_AMBULATORY_CARE_PROVIDER_SITE_OTHER)

## 2023-05-02 ENCOUNTER — Other Ambulatory Visit: Payer: Self-pay

## 2023-05-02 DIAGNOSIS — Z0000 Encounter for general adult medical examination without abnormal findings: Secondary | ICD-10-CM | POA: Diagnosis not present

## 2023-05-02 LAB — URINALYSIS, ROUTINE W REFLEX MICROSCOPIC
Bilirubin Urine: NEGATIVE
Hgb urine dipstick: NEGATIVE
Ketones, ur: NEGATIVE
Leukocytes,Ua: NEGATIVE
Nitrite: NEGATIVE
RBC / HPF: NONE SEEN (ref 0–?)
Specific Gravity, Urine: 1.025 (ref 1.000–1.030)
Total Protein, Urine: NEGATIVE
Urine Glucose: NEGATIVE
Urobilinogen, UA: 0.2 (ref 0.0–1.0)
pH: 6 (ref 5.0–8.0)

## 2023-05-02 LAB — CBC WITH DIFFERENTIAL/PLATELET
Basophils Absolute: 0.1 10*3/uL (ref 0.0–0.1)
Basophils Relative: 1.2 % (ref 0.0–3.0)
Eosinophils Absolute: 0.5 10*3/uL (ref 0.0–0.7)
Eosinophils Relative: 10.5 % — ABNORMAL HIGH (ref 0.0–5.0)
HCT: 43.1 % (ref 39.0–52.0)
Hemoglobin: 14.4 g/dL (ref 13.0–17.0)
Lymphocytes Relative: 31.7 % (ref 12.0–46.0)
Lymphs Abs: 1.5 10*3/uL (ref 0.7–4.0)
MCHC: 33.4 g/dL (ref 30.0–36.0)
MCV: 95.9 fl (ref 78.0–100.0)
Monocytes Absolute: 0.3 10*3/uL (ref 0.1–1.0)
Monocytes Relative: 6.6 % (ref 3.0–12.0)
Neutro Abs: 2.4 10*3/uL (ref 1.4–7.7)
Neutrophils Relative %: 50 % (ref 43.0–77.0)
Platelets: 258 10*3/uL (ref 150.0–400.0)
RBC: 4.5 Mil/uL (ref 4.22–5.81)
RDW: 13.6 % (ref 11.5–15.5)
WBC: 4.7 10*3/uL (ref 4.0–10.5)

## 2023-05-02 LAB — LIPID PANEL
Cholesterol: 186 mg/dL (ref 0–200)
HDL: 32.9 mg/dL — ABNORMAL LOW (ref >39.00)
LDL Cholesterol: 129 mg/dL — ABNORMAL HIGH (ref 0–99)
NonHDL: 152.73
Total CHOL/HDL Ratio: 6
Triglycerides: 117 mg/dL (ref 0.0–149.0)
VLDL: 23.4 mg/dL (ref 0.0–40.0)

## 2023-05-02 LAB — PSA: PSA: 2.49 ng/mL (ref 0.10–4.00)

## 2023-05-02 LAB — TSH: TSH: 13.3 u[IU]/mL — ABNORMAL HIGH (ref 0.35–5.50)

## 2023-05-04 ENCOUNTER — Other Ambulatory Visit: Payer: Self-pay | Admitting: Internal Medicine

## 2023-05-06 ENCOUNTER — Ambulatory Visit: Payer: Self-pay | Admitting: Internal Medicine

## 2023-05-06 ENCOUNTER — Encounter: Payer: Self-pay | Admitting: Internal Medicine

## 2023-05-06 VITALS — BP 128/86 | HR 70 | Temp 97.7°F | Ht 72.0 in | Wt 207.0 lb

## 2023-05-06 DIAGNOSIS — Z0000 Encounter for general adult medical examination without abnormal findings: Secondary | ICD-10-CM | POA: Diagnosis not present

## 2023-05-06 DIAGNOSIS — E034 Atrophy of thyroid (acquired): Secondary | ICD-10-CM

## 2023-05-06 DIAGNOSIS — R972 Elevated prostate specific antigen [PSA]: Secondary | ICD-10-CM | POA: Diagnosis not present

## 2023-05-06 NOTE — Assessment & Plan Note (Signed)
  We discussed age appropriate health related issues, including available/recomended screening tests and vaccinations. Labs were ordered to be later reviewed . All questions were answered. We discussed one or more of the following - seat belt use, use of sunscreen/sun exposure exercise, safe sex, fall risk reduction, second hand smoke exposure, firearm use and storage, seat belt use, a need for adhering to healthy diet and exercise. Labs were ordered.  All questions were answered. Colon due 2027, last in 2022, due in 2029 Shingrix 2020

## 2023-05-06 NOTE — Progress Notes (Signed)
 Subjective:  Patient ID: Derrick Walters, male    DOB: 04-18-1960  Age: 63 y.o. MRN: [id number]  CC: Annual Exam (Patient wanted to go some of the abnormal labs.)   HPI Derrick Walters presents for a well   Outpatient Medications Prior to Visit  Medication Sig Dispense Refill   levothyroxine (SYNTHROID) 150 MCG tablet TAKE 1 TABLET BY MOUTH EVERY DAY BEFORE BREAKFAST 90 tablet 3   Red Yeast Rice Extract (RED YEAST RICE PO) Take by mouth. 1200 mg     acyclovir (ZOVIRAX) 200 MG capsule Take 200 mg by mouth daily. (Patient not taking: Reported on 05/06/2023)     Ascorbic Acid (VITAMIN C) 1000 MG tablet Take 1,000 mg by mouth daily. (Patient not taking: Reported on 05/06/2023)     Cholecalciferol 1000 UNITS tablet Take 1,000 Units by mouth daily. (Patient not taking: Reported on 05/06/2023)     Flaxseed, Linseed, (FLAX SEEDS PO) Take 1 tablet by mouth daily.  (Patient not taking: Reported on 05/06/2023)     levofloxacin (LEVAQUIN) 500 MG tablet Take 1 tablet (500 mg total) by mouth daily. (Patient not taking: Reported on 05/06/2023) 10 tablet 0   promethazine-dextromethorphan (PROMETHAZINE-DM) 6.25-15 MG/5ML syrup Take 5 mLs by mouth 4 (four) times daily as needed for cough. (Patient not taking: Reported on 05/06/2023) 240 mL 0   No facility-administered medications prior to visit.    ROS: Review of Systems  Constitutional:  Negative for appetite change, fatigue and unexpected weight change.  HENT:  Negative for congestion, nosebleeds, sneezing, sore throat and trouble swallowing.   Eyes:  Negative for itching and visual disturbance.  Respiratory:  Negative for cough.   Cardiovascular:  Negative for chest pain, palpitations and leg swelling.  Gastrointestinal:  Negative for abdominal distention, blood in stool, diarrhea and nausea.  Genitourinary:  Negative for frequency and hematuria.  Musculoskeletal:  Negative for back pain, gait problem, joint swelling and neck pain.  Skin:  Negative for rash.   Neurological:  Negative for dizziness, tremors, speech difficulty and weakness.  Psychiatric/Behavioral:  Negative for agitation, dysphoric mood and sleep disturbance. The patient is not nervous/anxious.     Objective:  BP 128/86 (BP Location: Left Arm, Patient Position: Sitting, Cuff Size: Normal)   Pulse 70   Temp 97.7 F (36.5 C) (Oral)   Ht 6' (1.829 m)   Wt 207 lb (93.9 kg)   SpO2 93%   BMI 28.07 kg/m   BP Readings from Last 3 Encounters:  05/06/23 128/86  06/24/22 130/80  03/18/22 (!) 140/90    Wt Readings from Last 3 Encounters:  05/06/23 207 lb (93.9 kg)  06/24/22 201 lb 6.4 oz (91.4 kg)  03/18/22 203 lb 9.6 oz (92.4 kg)    Physical Exam Constitutional:      General: He is not in acute distress.    Appearance: He is well-developed.     Comments: NAD  Eyes:     Conjunctiva/sclera: Conjunctivae normal.     Pupils: Pupils are equal, round, and reactive to light.  Neck:     Thyroid: No thyromegaly.     Vascular: No JVD.  Cardiovascular:     Rate and Rhythm: Normal rate and regular rhythm.     Heart sounds: Normal heart sounds. No murmur heard.    No friction rub. No gallop.  Pulmonary:     Effort: Pulmonary effort is normal. No respiratory distress.     Breath sounds: Normal breath sounds. No wheezing or rales.  Chest:     Chest wall: No tenderness.  Abdominal:     General: Bowel sounds are normal. There is no distension.     Palpations: Abdomen is soft. There is no mass.     Tenderness: There is no abdominal tenderness. There is no guarding or rebound.  Genitourinary:    Prostate: Normal.     Rectum: Normal. Guaiac result negative.  Musculoskeletal:        General: No tenderness. Normal range of motion.     Cervical back: Normal range of motion.  Lymphadenopathy:     Cervical: No cervical adenopathy.  Skin:    General: Skin is warm and dry.     Findings: No rash.  Neurological:     Mental Status: He is alert and oriented to person, place, and  time.     Cranial Nerves: No cranial nerve deficit.     Motor: No abnormal muscle tone.     Coordination: Coordination normal.     Gait: Gait normal.     Deep Tendon Reflexes: Reflexes are normal and symmetric.  Psychiatric:        Behavior: Behavior normal.        Thought Content: Thought content normal.        Judgment: Judgment normal.     Lab Results  Component Value Date   WBC 4.7 05/02/2023   HGB 14.4 05/02/2023   HCT 43.1 05/02/2023   PLT 258.0 05/02/2023   GLUCOSE 95 03/16/2022   CHOL 186 05/02/2023   TRIG 117.0 05/02/2023   HDL 32.90 (L) 05/02/2023   LDLCALC 129 (H) 05/02/2023   ALT 14 03/16/2022   AST 17 03/16/2022   NA 139 03/16/2022   K 4.0 03/16/2022   CL 102 03/16/2022   CREATININE 1.27 03/16/2022   BUN 19 03/16/2022   CO2 28 03/16/2022   TSH 13.30 (H) 05/02/2023   PSA 2.49 05/02/2023    CT Chest High Resolution Result Date: 05/20/2022 CLINICAL DATA:  Follow-up left lower lobe infiltrate EXAM: CT CHEST WITHOUT CONTRAST TECHNIQUE: Multidetector CT imaging of the chest was performed following the standard protocol without intravenous contrast. High resolution imaging of the lungs, as well as inspiratory and expiratory imaging, was performed. RADIATION DOSE REDUCTION: This exam was performed according to the departmental dose-optimization program which includes automated exposure control, adjustment of the mA and/or kV according to patient size and/or use of iterative reconstruction technique. COMPARISON:  Chest radiographs, 04/13/2022, 02/09/2022 CT coronary calcium scoring, 02/07/2019 FINDINGS: Cardiovascular: Scattered aortic atherosclerosis. Normal heart size. Scattered left coronary artery calcifications. No pericardial effusion. Mediastinum/Nodes: No enlarged mediastinal, hilar, or axillary lymph nodes. Thyroid gland, trachea, and esophagus demonstrate no significant findings. Lungs/Pleura: Mild diffuse bilateral bronchial wall thickening. Irregular ground-glass,  consolidation, and probable underlying scarring or atelectasis of the peripheral left lung base (series 8, image 121). Additional bandlike scarring and volume loss of the right middle lobe, particularly of the paramedian medial segment (series 8, image 120). No significant air trapping on expiratory phase imaging. No pleural effusion or pneumothorax. Upper Abdomen: No acute abnormality. Musculoskeletal: No chest wall abnormality. No acute osseous findings. IMPRESSION: 1. Irregular ground-glass, consolidation, and probable underlying scarring or atelectasis of the peripheral left lung base. Findings are favored to reflect some degree of acute airspace disease with underlying chronic sequelae of infection or aspiration. Consider CT follow-up in 3 months to assess for resolution. 2. Additional bandlike scarring and volume loss of the right middle lobe, particularly of the paramedian medial segment,  again consistent with chronic sequelae of infection. 3. Mild diffuse bilateral bronchial wall thickening, consistent with nonspecific infectious or inflammatory bronchitis. 4. No evidence of fibrotic interstitial lung disease. 5. Coronary artery disease. Aortic Atherosclerosis (ICD10-I70.0). Electronically Signed   By: Jearld Lesch M.D.   On: 05/20/2022 14:56    Assessment & Plan:   Problem List Items Addressed This Visit     Hypothyroidism   Relevant Orders   T4, free   TSH   Well adult exam    We discussed age appropriate health related issues, including available/recomended screening tests and vaccinations. Labs were ordered to be later reviewed . All questions were answered. We discussed one or more of the following - seat belt use, use of sunscreen/sun exposure exercise, safe sex, fall risk reduction, second hand smoke exposure, firearm use and storage, seat belt use, a need for adhering to healthy diet and exercise. Labs were ordered.  All questions were answered. Colon due 2027, last in 2022, due in  2029 Shingrix 2020      Elevated PSA - Primary   PSA      Relevant Orders   PSA      No orders of the defined types were placed in this encounter.     Follow-up: Return in about 6 months (around 11/06/2023) for a follow-up visit.  Sonda Primes, MD

## 2023-05-06 NOTE — Assessment & Plan Note (Signed)
 PSA

## 2023-06-03 ENCOUNTER — Ambulatory Visit (HOSPITAL_BASED_OUTPATIENT_CLINIC_OR_DEPARTMENT_OTHER)

## 2023-11-14 ENCOUNTER — Encounter: Payer: Self-pay | Admitting: Internal Medicine

## 2023-11-24 ENCOUNTER — Other Ambulatory Visit (INDEPENDENT_AMBULATORY_CARE_PROVIDER_SITE_OTHER)

## 2023-11-24 DIAGNOSIS — R972 Elevated prostate specific antigen [PSA]: Secondary | ICD-10-CM | POA: Diagnosis not present

## 2023-11-24 DIAGNOSIS — E034 Atrophy of thyroid (acquired): Secondary | ICD-10-CM | POA: Diagnosis not present

## 2023-11-24 LAB — T4, FREE: Free T4: 0.81 ng/dL (ref 0.60–1.60)

## 2023-11-24 LAB — PSA: PSA: 2.69 ng/mL (ref 0.10–4.00)

## 2023-11-24 LAB — TSH: TSH: 26.53 u[IU]/mL — ABNORMAL HIGH (ref 0.35–5.50)

## 2023-11-25 ENCOUNTER — Ambulatory Visit: Payer: Self-pay | Admitting: Internal Medicine
